# Patient Record
Sex: Male | Born: 1970 | Race: White | Hispanic: No | Marital: Married | Smoking: Never smoker
Health system: Southern US, Community
[De-identification: ages and names within clinical notes are randomized; demographics above are authoritative.]

## PROBLEM LIST (undated history)

## (undated) DIAGNOSIS — I2510 Atherosclerotic heart disease of native coronary artery without angina pectoris: Secondary | ICD-10-CM

## (undated) DIAGNOSIS — E785 Hyperlipidemia, unspecified: Secondary | ICD-10-CM

## (undated) HISTORY — DX: Atherosclerotic heart disease of native coronary artery without angina pectoris: I25.10

## (undated) HISTORY — DX: Hyperlipidemia, unspecified: E78.5

---

## 2008-03-22 DIAGNOSIS — I2510 Atherosclerotic heart disease of native coronary artery without angina pectoris: Secondary | ICD-10-CM

## 2008-03-22 HISTORY — DX: Atherosclerotic heart disease of native coronary artery without angina pectoris: I25.10

## 2008-04-05 ENCOUNTER — Ambulatory Visit: Payer: Self-pay | Admitting: Diagnostic Radiology

## 2008-04-05 ENCOUNTER — Encounter: Payer: Self-pay | Admitting: Emergency Medicine

## 2008-04-06 ENCOUNTER — Observation Stay (HOSPITAL_COMMUNITY): Admission: EM | Admit: 2008-04-06 | Discharge: 2008-04-06 | Payer: Self-pay | Admitting: Cardiovascular Disease

## 2008-04-12 ENCOUNTER — Ambulatory Visit (HOSPITAL_COMMUNITY): Admission: RE | Admit: 2008-04-12 | Discharge: 2008-04-12 | Payer: Self-pay | Admitting: Cardiovascular Disease

## 2010-01-19 IMAGING — CT CT HEART MORP W/ CTA COR W/ SCORE W/ CA W/CM &/OR W/O CM
1 of 3 series · 12 of 20 positions shown, 15 images · IV contrast (APPLIED)
Comparison: none

Addendum Begins  Original report by Dr. Ognian Berkan.  Following addendum by Dr. Angie Camila Ferrero on 04/21/2008:
 CARDIAC CTA REPORT
 REFERRING PHYSICIAN:  Quidgley, Marvin Martinez Duarte
 [place] physician: Nadler, Janay, Ph.D.
 REASON FOR PROCEDURE:  The patient had a recent syncopal event and after evaluation by Dr. Holger Mario Bahua at [REDACTED], he was referred for cardiac CT.  He does have a strong family history of coronary disease with his father having significant coronary disease noted in his early 40s.
 PROCEDURE IN DETAIL:  The risks and benefits of the procedure were detailed to the patient and consent was obtained.  He was brought to the CT scanner and placed in appropriate position on the table, 50 mg of IV metoprolol have to be given to decrease his heart rate to the low 60s.  Nitroglycerin 0.4 mg sublingual was given x1.  The usual algorithm of imaging was performed including a topographic image, calcium score, test bolus, and a final image with less than 120 cc of contrast use.  No complications were reported.  Heart rate at the time of imaging was 61 to 62 beats per minute, though this fluctuated up to high of 65 beats per minute.  The images were viewed on an independent computer workstation using 3D imaging software.  The patient was encouraged to drink plenty of fluids and he was discharged home after 15 minutes monitoring following the procedure.  No complications were reported by the patient post procedure.
 Coronary Anatomy:
 Left main:  The left main is a moderate-to-large-sized vessel that bifurcates into the LAD in the left circumflex.  No significant disease is noted.
 Left Anterior Descending:  The LAD is a moderate-to-large-sized vessel that extends to the apex and tapers distally.  There is some non-occlusive mild plaquing noted in the proximal LAD that is mixed calcified plaque.  He does have 1 D1 vessel that is small-to-moderate in size and takes off proximally.  He has a second diagonal branch that is moderate in size and bifurcates and takes off in the mid region of the LAD.  He has a third distal diagonal vessel that is small in caliber.  There is no other significant disease noted in the diagonal branches.
 Left Circumflex:   The left circumflex is a moderate-sized vessel with 2 small-to-moderate size obtuse marginal branches.  The OM1 takes off in the mid region and OM2 takes off distally.  True circumflex is a short vessel that does not extend very far.
 Right Coronary Artery:  The RCA is a large branch that extends distally and has a moderate-sized long PD and PL branch.  No significant disease noted.
 Cardiac Anatomy:
 Left atrium:  Left atrium is normal size.
 Right atrium is normal in size.
 Left ventricle:  Normal in size with normal wall thickness and normal systolic function.  No focal wall motion abnormalities noted.
 Right ventricle is normal in size with normal systolic function.
 Pulmonary artery appears to have normal caliber with no pathology or significant disease noted.
 Pulmonary Veins:  No evidence of stenosis or other pathology.
 Aorta:  No significant calcification noted and aorta is of normal caliber in the ascending and descending regions.  No evidence of dissection noted.
 FINAL IMPRESSION:
 Mild mixed calcified and non-calcified plaque noted in the proximal LAD that is not occlusive.  Otherwise, no significant disease noted.  No other cardiac abnormalities noted.  Coronary calcium score is 58.  This number reflects the calcium in the proximal LAD.  

 Addendum Ends
OVER-READ INTERPRETATION - CT CHEST
The following report is an over-read performed by radiologist Dr.
Roxana Angelica Biggs, M.D. [REDACTED] on 04/12/2008
[DATE].  This over-read does not include interpretation of
cardiac or coronary anatomy or pathology.  The CTA interpretation
by the cardiologist is attached.

[Series 9: 70% only · axial · 0.41mm/px · z∈[-214,-79]mm · 12 of 391 slices shown, 15 images]
[im 27/391  vessel]
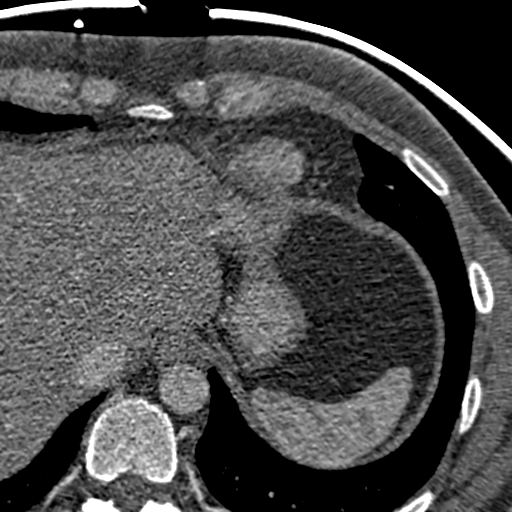
[im 27/391  lung]
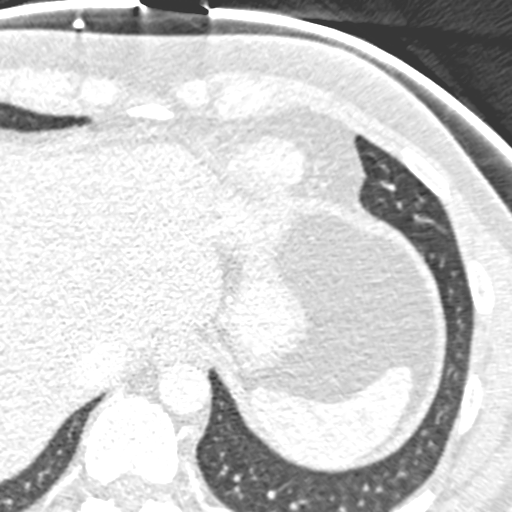
[im 53/391  vessel]
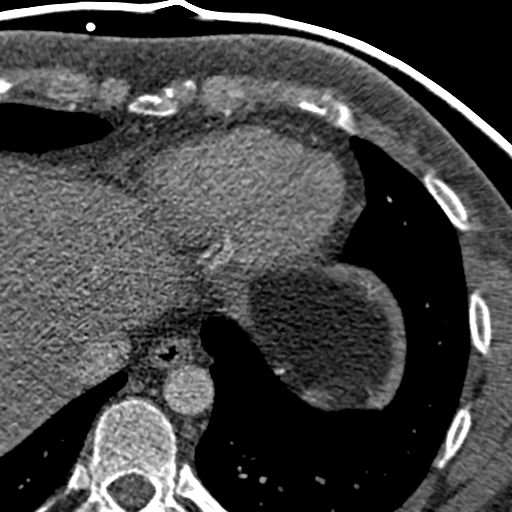
[im 79/391  vessel]
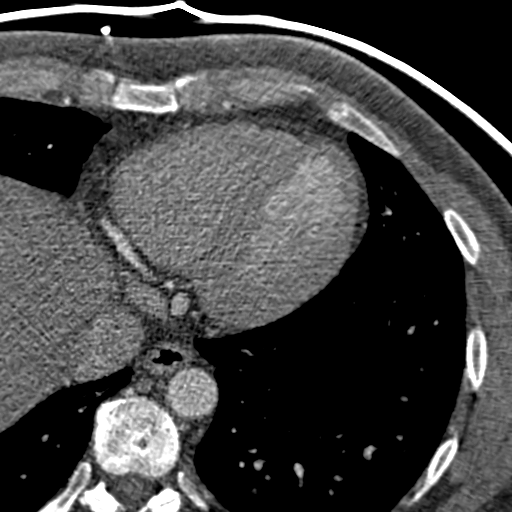
[im 131/391  vessel]
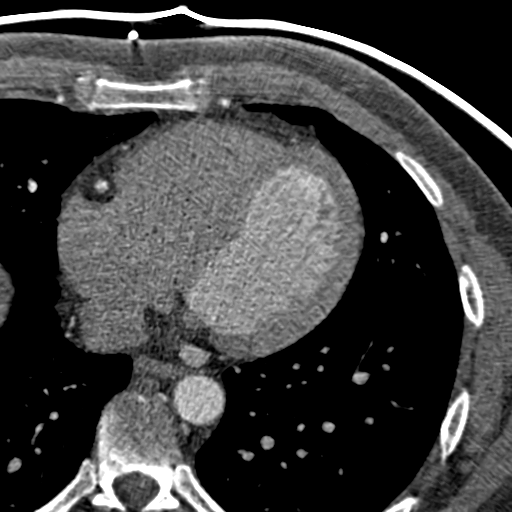
[im 157/391  vessel]
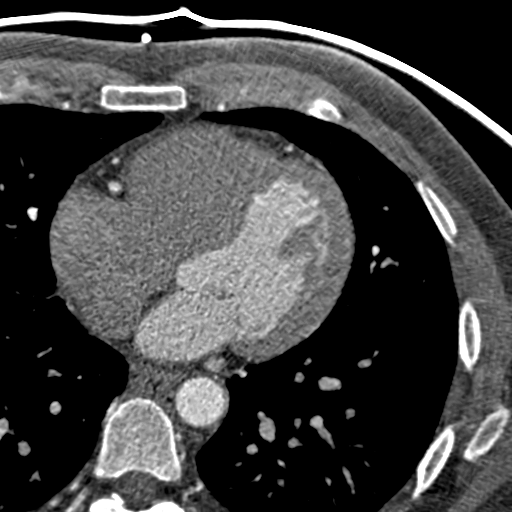
[im 157/391  lung]
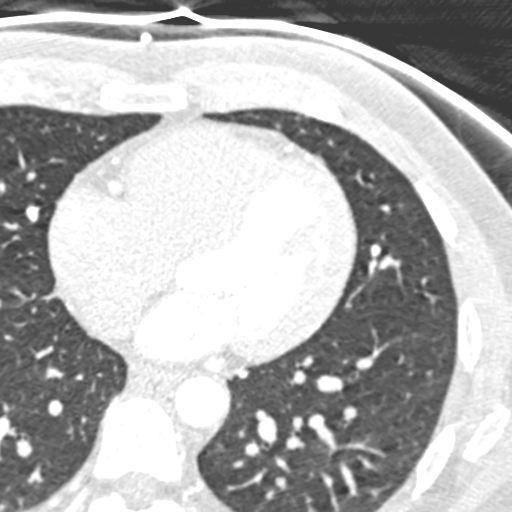
[im 183/391  vessel]
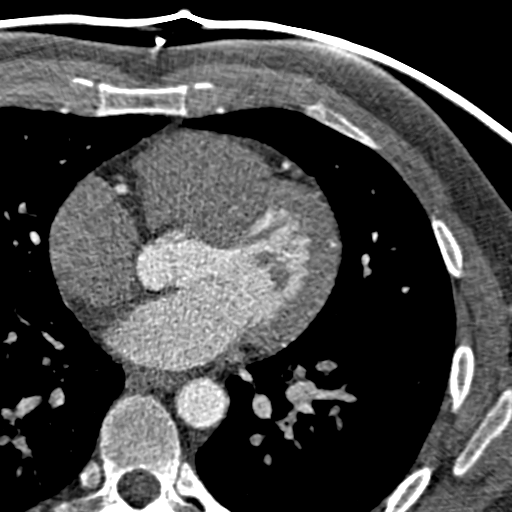
[im 209/391  vessel]
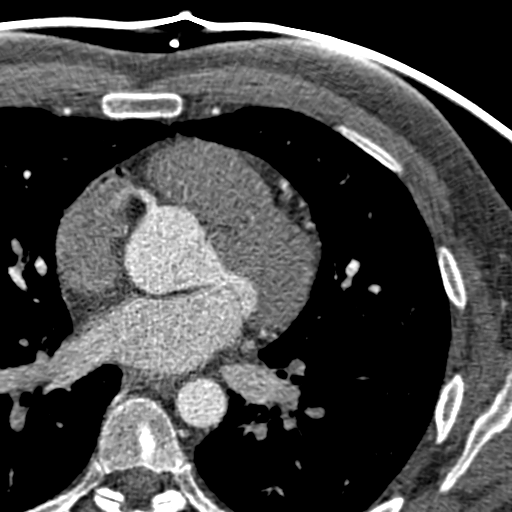
[im 235/391  vessel]
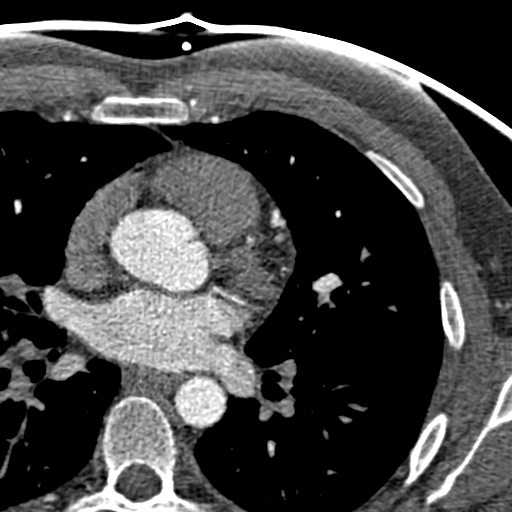
[im 261/391  vessel]
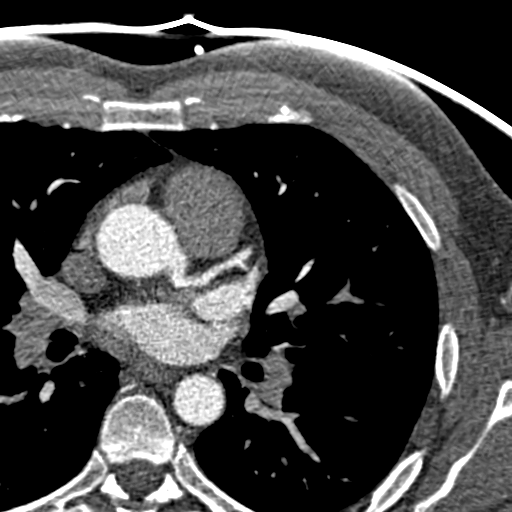
[im 261/391  lung]
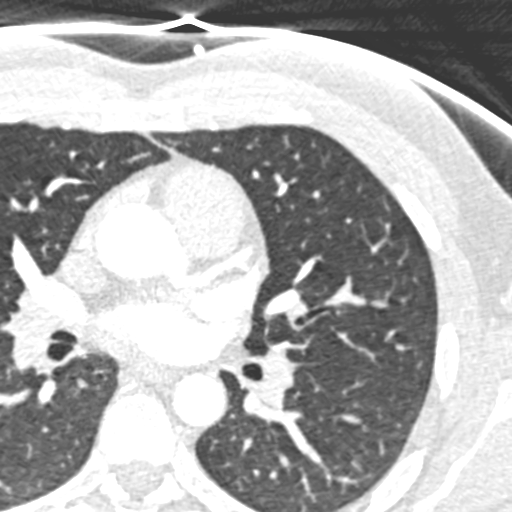
[im 313/391  vessel]
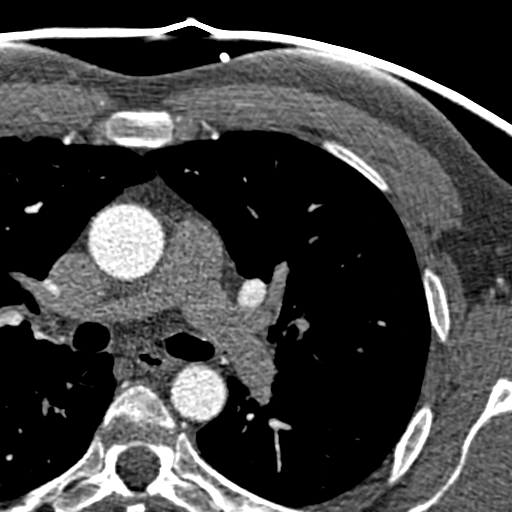
[im 339/391  vessel]
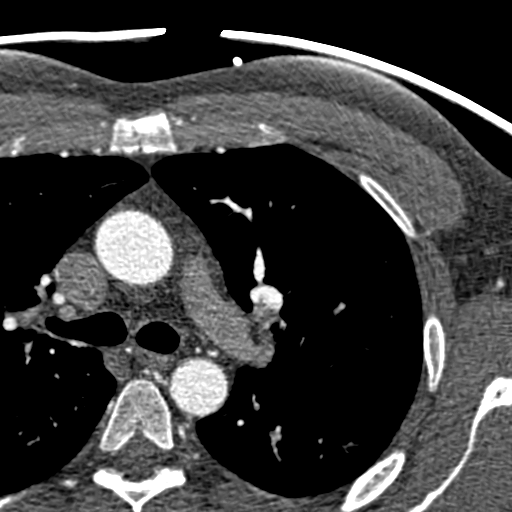
[im 365/391  vessel]
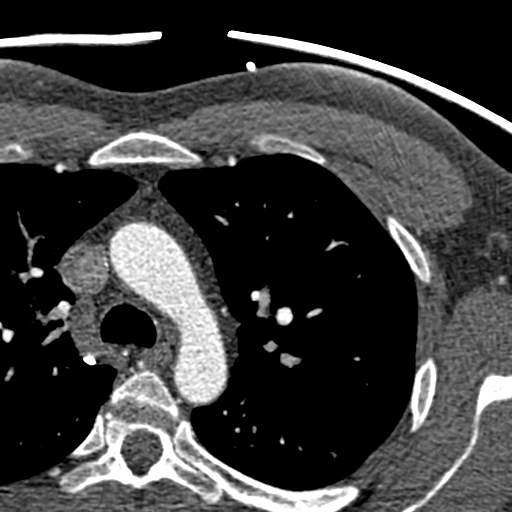

[12 of 20 positions shown; findings below may reference images not displayed]

FINDINGS: No pathologically enlarged lymph nodes in the visualized
portions of the mediastinum and both hilar regions.  Ascending
aorta measures 3.3 cm.  Tiny nodule along the left hemidiaphragm
may represent a small subpleural lymph node.  Lungs otherwise
clear.  No pleural fluid.  Visualized airway is unremarkable.
Incidental imaging of the upper abdomen shows a 6 mm low
attenuation lesion in the right hepatic lobe, which is likely a
small cyst or hemangioma.  No worrisome lytic or sclerotic lesions.
IMPRESSION: No acute extracardiac findings in the visualized portions of the
chest.

## 2010-05-04 LAB — BASIC METABOLIC PANEL
CO2: 27 mEq/L (ref 19–32)
Calcium: 8.7 mg/dL (ref 8.4–10.5)
Calcium: 9.2 mg/dL (ref 8.4–10.5)
GFR calc Af Amer: >60 mL/min (ref >60)
GFR calc Af Amer: >60 mL/min (ref >60)
GFR calc non Af Amer: >60 mL/min (ref >60)
Potassium: 3.5 mEq/L (ref 3.5–5.1)
Sodium: 138 mEq/L (ref 135–145)
Sodium: 140 mEq/L (ref 135–145)

## 2010-05-04 LAB — CARDIAC PANEL(CRET KIN+CKTOT+MB+TROPI)
CK, MB: 1.1 ng/mL (ref 0.3–4.0)
CK, MB: 1.1 ng/mL (ref 0.3–4.0)
Total CK: 131 U/L (ref 7–232)
Total CK: 132 U/L (ref 7–232)

## 2010-05-04 LAB — POCT CARDIAC MARKERS
CKMB, poc: <1 ng/mL — ABNORMAL LOW (ref 1.0–8.0)
Myoglobin, poc: 63.1 ng/mL (ref 12–200)

## 2010-05-04 LAB — CBC
HCT: 40.7 % (ref 39.0–52.0)
Hemoglobin: 14.1 g/dL (ref 13.0–17.0)
Hemoglobin: 14.7 g/dL (ref 13.0–17.0)
RBC: 5.16 MIL/uL (ref 4.22–5.81)
RDW: 12.9 % (ref 11.5–15.5)
WBC: 6.3 10*3/uL (ref 4.0–10.5)

## 2010-05-04 LAB — TSH: TSH: 0.786 u[IU]/mL (ref 0.350–4.500)

## 2010-05-04 LAB — MAGNESIUM: Magnesium: 2.1 mg/dL (ref 1.5–2.5)

## 2010-06-06 NOTE — Discharge Summary (Signed)
NAME:  Alexander Booker, Alexander Booker NO.:  [id number]   MEDICAL RECORD NO.:  [id number]           PATIENT TYPE:   LOCATION:                                 FACILITY:   PHYSICIAN:  Nicki Guadalajara, M.D.     DATE OF BIRTH:  September 17, 1970   DATE OF ADMISSION:  04/05/2008  DATE OF DISCHARGE:  04/05/2008                               DISCHARGE SUMMARY   DISCHARGE DIAGNOSES:  1. Vasovagal post micturition syncope.  2. Recent cardiac workup with negative stress test, negative echo, and      now negative myocardial infarction.  3. Strong family history of coronary disease including sudden death.  4. Dyslipidemia, treated and medication may have added to vasovagal      symptoms.  5. Vitamin D deficiency with treatment.   DISCHARGE CONDITION:  Improved.   PROCEDURES:  None.   DISCHARGE MEDICATIONS:  1. Lexapro 5 mg daily.  2. Vitamin D 50,000 units weekly.  3. Stop Simcor.  4. Crestor 10 mg daily.   DISCHARGE INSTRUCTIONS:  1. May return to work on April 07, 2008, low sodium, heart-healthy      diet.  2. No restrictions on activity.  3. Follow up with Dr. Tresa Endo, April 30, 2008, at 2:30 p.m.  4. He will be scheduled for a monitor, the office will call with date      and time.  He will be scheduled for a cardiac CAT scan,      angiography, the office will call with date and time.   HISTORY OF PRESENT ILLNESS:  A 40 year old white male presented to the  emergency room after an episode of syncope.  The patient initially had  been placed on Simcor due to dyslipidemia, and a strong family history  of coronary disease.  He did have hot flushing with it, and he sat up,  took sips of water, walked to the bathroom, and was seated on the  commode, urinating, and at the end of the urination, he had a syncopal  episode.  He did not have any chest pain or palpitations.  No neuro  deficits came too quickly, felt oozy for 30 minutes, and then complained  of headache.  He had struck his head  during the fall.  He went to the  [facility] at [facility].  CT of his head was done, which was  negative.  He was transferred to [facility] for workup of his syncope.  He previously had syncope greater than 10 years ago, and not associated  with urination, but he did not recall any further details.  He was  admitted mainly because his EKG was abnormal and there was no old one to  compare it, but on comparison today it is no different actually if  anything is now, EKGs are improved from the office EKGs.   OUTPATIENT MEDICATIONS:  1. Simcor 1000/20.  2. Lexapro 10.  3. Aspirin 81.  4. Vitamin D 50,000 units weekly.   ALLERGIES:  PENICILLIN.   PAST MEDICAL HISTORY:  Dyslipidemia, depression, and deviated septum.   FAMILY HISTORY:  See H and P.   SOCIAL HISTORY:  See H and P.   REVIEW OF SYSTEMS:  See H and P.   PHYSICAL EXAMINATION:  VITAL SIGNS:  At discharge, blood pressure this  morning 120/80, pulse 74, respirations 16, temp 97.9, and oxygen  saturation on room air.  Lying down, blood pressure 122/76, sitting  120/80, standing 128/77 with pulse 86 throughout.  He is afebrile.  GENERAL:  Alert and oriented male in no acute distress.  LUNGS:  Clear.  HEART:  Heart sounds S1, S2.  Regular rate and rhythm.  HEENT:  He does have ecchymosis around his right eye, which he had  injured in his syncopal episode.   RADIOLOGY:  Chest x-ray here on admission, no evidence of acute  cardiopulmonary disease.  CT of the head, no acute intracranial findings  or mass lesions.  There was a scalp hematoma of the right frontal area.  The globes were intact.   LABORATORY DATA:  Hemoglobin 14.1, hematocrit 40.7, WBC 6.3, platelets  200, MCV 85.3.  Chemistry:  Sodium 138; potassium was 3.5, it was  replaced with 40 of potassium; chloride 104; CO2 of 27; BUN 9;  creatinine is 0.95; and glucose 99.  Coags, initially, protime 15.7, INR  of 1.2, PTT on heparin was greater than 200, heparin  was discontinued,  and heparin down to 0.38.  Troponins; CK is 132 and 131, MB is 1.1 and  1.1, and troponin I less than 0.01.   Electrolytes:  Calcium 8.7, magnesium 2.1, D-dimer was negative at less  than 0.22, and TSH was 0.786.   The patient's EKG with T wave inversions in II, III, aVF; and V4, V5,  and V6 without change if anything improved.  The patient was admitted by  Dr. Tresa Endo during the morning hours of April 05, 2008.  He was seen and  felt stable.  Cardiac markers have all returned negative.  CK-MB, troponins, and EKG  is stable.  The patient is nonorthostatic, he will be discharged home,  and set up for CT and cardiac angio as well as a monitor to wear.   Dr. Tresa Endo saw him and assessed him.      Darcella Gasman. Annie Paras, N.P.    ______________________________  Nicki Guadalajara, M.D.    LRI/MEDQ  D:  04/06/2008  T:  04/07/2008  Job:  [id number]   cc:   Sigmund Hazel, M.D.  Nicki Guadalajara, M.D.

## 2012-04-01 ENCOUNTER — Encounter: Payer: Self-pay | Admitting: Cardiology

## 2012-04-18 ENCOUNTER — Encounter: Payer: Self-pay | Admitting: Cardiovascular Disease

## 2012-06-20 ENCOUNTER — Other Ambulatory Visit: Payer: Self-pay | Admitting: *Deleted

## 2012-06-20 MED ORDER — METOPROLOL SUCCINATE ER 50 MG PO TB24: 50.0000 mg | ORAL_TABLET | Freq: Every day | ORAL | Status: DC

## 2012-08-11 ENCOUNTER — Other Ambulatory Visit: Payer: Self-pay | Admitting: *Deleted

## 2012-08-11 MED ORDER — ROSUVASTATIN CALCIUM 20 MG PO TABS: 20.0000 mg | ORAL_TABLET | Freq: Every day | ORAL | Status: DC

## 2012-09-18 ENCOUNTER — Ambulatory Visit (INDEPENDENT_AMBULATORY_CARE_PROVIDER_SITE_OTHER): Payer: BC Managed Care – PPO | Admitting: Cardiovascular Disease

## 2012-09-18 ENCOUNTER — Encounter: Payer: Self-pay | Admitting: Cardiovascular Disease

## 2012-09-18 VITALS — BP 110/78 | HR 65 | Ht 69.0 in | Wt 177.1 lb

## 2012-09-18 DIAGNOSIS — R5381 Other malaise: Secondary | ICD-10-CM

## 2012-09-18 DIAGNOSIS — Z8249 Family history of ischemic heart disease and other diseases of the circulatory system: Secondary | ICD-10-CM

## 2012-09-18 DIAGNOSIS — E782 Mixed hyperlipidemia: Secondary | ICD-10-CM

## 2012-09-18 DIAGNOSIS — I2510 Atherosclerotic heart disease of native coronary artery without angina pectoris: Secondary | ICD-10-CM

## 2012-09-18 DIAGNOSIS — E785 Hyperlipidemia, unspecified: Secondary | ICD-10-CM | POA: Insufficient documentation

## 2012-09-18 NOTE — Progress Notes (Signed)
Patient ID: Alexander Booker, male   DOB: 02-28-1970, 42 y.o.   MRN: [id number]     HPI: Alexander Booker, is a 42 y.o. male who presents to the office today for one-year cardiology evaluation.  Alexander Booker has a very strong family history for premature coronary artery disease. His father had suffered initial heart attack at age 3 and has undergone 2 CBG revascularization surgeries. 3 of 4 of his father's siblings had significant heart problems with one uncle suffering a heart attack in his 77s with one dying at age 59 with sudden death.  Alexander Booker has a history of hyperlipidemia. He has been demonstrated to have inferolateral T-wave inversion on his ECG. Cardiac CT in the past with a calcium score 58 with small nonocclusive plaque that was mixed and calcified in the proximal LAD. A nuclear perfusion study in 2012 this showed normal perfusion. He has low HDL levels. In the past he was unable to tolerate Niaspan. Remotely had performed a [place] lab assessment.  Over the past year, he has been traveling with his job in [employer]. This has reduced the amount of exercise that he had been able to do. When I had seen him last year he had lost almost 20 pounds from the year before. He has gained approximately half of that back. He denies chest pain PND orthopnea or shortness of breath.  Past Medical History  Diagnosis Date  . Coronary artery disease 3/10    non obsructive Ca++ on CT of [place]  . Dyslipidemia     History reviewed. No pertinent past surgical history.  Allergies  Allergen Reactions  . Penicillins Rash    seizures  . Niacin And Related     Causes blackouts    Current Outpatient Prescriptions  Medication Sig Dispense Refill  . aspirin 81 MG tablet Take 81 mg by mouth daily.      . citalopram (CELEXA) 20 MG tablet Take 20 mg by mouth daily.      . metoprolol succinate (TOPROL-XL) 50 MG 24 hr tablet Take 1 tablet (50 mg total) by mouth daily. Take with or immediately  following a meal.  30 tablet  4  . rosuvastatin (CRESTOR) 20 MG tablet Take 1 tablet (20 mg total) by mouth daily. Need office appointment before anymore refills  30 tablet  1   No current facility-administered medications for this visit.    History   Social History  . Marital Status: Married    Spouse Name: N/A    Number of Children: N/A  . Years of Education: N/A   Occupational History  . Not on file.   Social History Main Topics  . Smoking status: Never Smoker   . Smokeless tobacco: Never Used  . Alcohol Use: 0.5 oz/week    1 drink(s) per week  . Drug Use: No  . Sexual Activity: Not on file   Other Topics Concern  . Not on file   Social History Narrative  . No narrative on file    Family History  Problem Relation Age of Onset  . Coronary artery disease Father 74    CABG twice  . Heart attack Father     ROS is negative for fevers, chills or night sweats. HEENT denies visual changes. He denies palpitations. Denies presyncope or syncope. There is no shortness of breath. There is no chest pain. He denies abdominal pain. He denies claudication. He denies paresthesias or myalgias.  Other system review is negative.  PE BP 110/78  Pulse 65  Ht 5\' 9"  (1.753 m)  Wt 177 lb 1.6 oz (80.332 kg)  BMI 26.14 kg/m2  General: Alert, oriented, no distress.  Skin: normal turgor, no rashes HEENT: Normocephalic, atraumatic. Pupils round and reactive; sclera anicteric;no lid lag.  Nose without nasal septal hypertrophy Mouth/Parynx benign; Mallinpatti scale 2 Neck: No JVD, no carotid briuts Lungs: clear to ausculatation and percussion; no wheezing or rales Heart: RRR, s1 s2 normal  Abdomen: No bruit; soft, nontender; no hepatosplenomehaly, BS+; abdominal aorta nontender and not dilated by palpation. Pulses 2+ Extremities: no clubbing cyanosis or edema, Homan's sign negative  Neurologic: grossly nonfocal  ECG: Sinus rhythm at 65 beats per minute. Normal intervals. Previously  noted nondiagnostic ST changes in leads 3 and aVF.  LABS:  BMET    Component Value Date/Time   NA 138 04/06/2008 0327   K 3.5 04/06/2008 0327   CL 104 04/06/2008 0327   CO2 27 04/06/2008 0327   GLUCOSE 99 04/06/2008 0327   BUN 9 04/06/2008 0327   CREATININE 0.95 04/06/2008 0327   CALCIUM 8.7 04/06/2008 0327   GFRNONAA >60 04/06/2008 0327   GFRAA  Value: >60        The eGFR has been calculated using the MDRD equation. This calculation has not been validated in all clinical situations. eGFR's persistently <60 mL/min signify possible Chronic Kidney Disease. 04/06/2008 0327     Hepatic Function Panel  No results found for this basename: prot, albumin, ast, alt, alkphos, bilitot, bilidir, ibili     CBC    Component Value Date/Time   WBC 6.3 04/06/2008 0815   RBC 4.78 04/06/2008 0815   HGB 14.1 04/06/2008 0815   HCT 40.7 04/06/2008 0815   PLT 200 04/06/2008 0815   MCV 85.3 04/06/2008 0815   MCHC 34.6 04/06/2008 0815   RDW 12.9 04/06/2008 0815     BNP No results found for this basename: probnp    Lipid Panel  No results found for this basename: chol, trig, hdl, cholhdl, vldl, ldlcalc     RADIOLOGY: No results found.    ASSESSMENT AND PLAN: Alexander Booker will be 42 years old in 2 days. He does have very strong family history for premature coronary artery disease and has a history of hyperlipidemia with low HDL levels. He has been tolerating Crestor 20 mg daily. He tells me his 41 year old brother was recently found to have a cholesterol of 300. The patient will undergo complete set of fasting lab work consisting of a CBC, competent metabolic panel, and NMR lipoprofile, and TSH. I will notify him regarding the results of the blood work and if changes need to be made adjustments will be made accordingly. Otherwise, I will see him in one year for followup evaluation.     Lennette Bihari, MD, [facility]  09/18/2012 10:22 AM

## 2012-09-18 NOTE — Patient Instructions (Signed)
Your physician recommends that you return for lab work fasting.  Your physician recommends that you schedule a follow-up appointment in: 1 YEAR 

## 2012-10-08 ENCOUNTER — Other Ambulatory Visit: Payer: Self-pay | Admitting: Cardiovascular Disease

## 2012-10-09 NOTE — Telephone Encounter (Signed)
Rx was sent to pharmacy electronically. 

## 2012-11-24 LAB — CBC
HCT: 41.9 % (ref 39.0–52.0)
Hemoglobin: 14.8 g/dL (ref 13.0–17.0)
MCV: 82.3 fL (ref 78.0–100.0)
RDW: 13.2 % (ref 11.5–15.5)
WBC: 5.7 10*3/uL (ref 4.0–10.5)

## 2012-11-25 LAB — COMPREHENSIVE METABOLIC PANEL
ALT: 19 U/L (ref 0–53)
CO2: 28 mEq/L (ref 19–32)
Creat: 1 mg/dL (ref 0.50–1.35)
Total Bilirubin: 1 mg/dL (ref 0.3–1.2)

## 2012-11-25 LAB — NMR LIPOPROFILE WITH LIPIDS
HDL Particle Number: 29.3 umol/L — ABNORMAL LOW (ref >=30.5)
LDL (calc): 65 mg/dL (ref <100)
LDL Particle Number: 1212 nmol/L — ABNORMAL HIGH (ref <1000)
LDL Size: 19.8 nm — ABNORMAL LOW (ref >20.5)
LP-IR Score: 65 — ABNORMAL HIGH (ref <=45)
Small LDL Particle Number: 952 nmol/L — ABNORMAL HIGH (ref <=527)
VLDL Size: 48.8 nm — ABNORMAL HIGH (ref <=46.6)

## 2012-12-01 ENCOUNTER — Other Ambulatory Visit: Payer: Self-pay | Admitting: *Deleted

## 2012-12-01 MED ORDER — METOPROLOL SUCCINATE ER 50 MG PO TB24: 50.0000 mg | ORAL_TABLET | Freq: Every day | ORAL | Status: DC

## 2013-05-12 ENCOUNTER — Telehealth: Payer: Self-pay | Admitting: Cardiovascular Disease

## 2013-05-12 MED ORDER — ROSUVASTATIN CALCIUM 40 MG PO TABS: 40.0000 mg | ORAL_TABLET | Freq: Every day | ORAL | Status: DC

## 2013-05-12 NOTE — Telephone Encounter (Signed)
He would like to have lab results that he had about 2 month ago please.

## 2013-05-12 NOTE — Telephone Encounter (Signed)
Call to pt and verified x 2.  Results given per MD in Result Note.  Pt verbalized understanding.   Rx sent to pharmacy: Crestor 40 mg daily; 90-day supply per pt request.  Pt also requested a copy of his lab results to be mailed and new code for MyChart.  (Done)  Message forwarded to Burna MortimerWanda, CMA to review w/ Dr. Tresa EndoKelly if/when labs to be repeated and mail lab order.

## 2013-07-06 ENCOUNTER — Other Ambulatory Visit: Payer: Self-pay | Admitting: Cardiovascular Disease

## 2013-07-06 NOTE — Telephone Encounter (Signed)
Rx was sent to pharmacy electronically. 

## 2013-09-07 ENCOUNTER — Other Ambulatory Visit: Payer: Self-pay | Admitting: *Deleted

## 2013-09-07 MED ORDER — METOPROLOL SUCCINATE ER 50 MG PO TB24: 50.0000 mg | ORAL_TABLET | Freq: Every day | ORAL | Status: DC

## 2013-09-15 ENCOUNTER — Other Ambulatory Visit: Payer: Self-pay

## 2013-09-15 MED ORDER — ROSUVASTATIN CALCIUM 40 MG PO TABS: 40.0000 mg | ORAL_TABLET | Freq: Every day | ORAL | Status: DC

## 2013-09-15 NOTE — Telephone Encounter (Signed)
Rx was sent to pharmacy electronically. 

## 2013-10-05 ENCOUNTER — Other Ambulatory Visit: Payer: Self-pay

## 2013-10-05 MED ORDER — METOPROLOL SUCCINATE ER 50 MG PO TB24: 50.0000 mg | ORAL_TABLET | Freq: Every day | ORAL | Status: DC

## 2013-10-05 NOTE — Telephone Encounter (Signed)
Rx was sent to pharmacy electronically. Second warning given. 

## 2013-10-13 ENCOUNTER — Other Ambulatory Visit: Payer: Self-pay | Admitting: *Deleted

## 2013-11-11 ENCOUNTER — Other Ambulatory Visit: Payer: Self-pay

## 2013-11-11 MED ORDER — METOPROLOL SUCCINATE ER 50 MG PO TB24: 50.0000 mg | ORAL_TABLET | Freq: Every day | ORAL | Status: DC

## 2013-11-11 NOTE — Telephone Encounter (Signed)
Rx sent to pharmacy   

## 2013-11-17 ENCOUNTER — Other Ambulatory Visit: Payer: Self-pay | Admitting: *Deleted

## 2013-11-17 MED ORDER — METOPROLOL SUCCINATE ER 50 MG PO TB24: 50.0000 mg | ORAL_TABLET | Freq: Every day | ORAL | Status: DC

## 2013-11-17 MED ORDER — ROSUVASTATIN CALCIUM 40 MG PO TABS: 40.0000 mg | ORAL_TABLET | Freq: Every day | ORAL | Status: DC

## 2013-11-21 ENCOUNTER — Other Ambulatory Visit: Payer: Self-pay | Admitting: Cardiovascular Disease

## 2013-11-23 NOTE — Telephone Encounter (Signed)
Rx was sent to pharmacy electronically. 

## 2013-12-15 ENCOUNTER — Encounter: Payer: Self-pay | Admitting: Cardiovascular Disease

## 2013-12-15 ENCOUNTER — Ambulatory Visit (INDEPENDENT_AMBULATORY_CARE_PROVIDER_SITE_OTHER): Payer: BC Managed Care – PPO | Admitting: Cardiovascular Disease

## 2013-12-15 VITALS — BP 106/72 | HR 71 | Ht 69.0 in | Wt 175.1 lb

## 2013-12-15 DIAGNOSIS — Z79899 Other long term (current) drug therapy: Secondary | ICD-10-CM

## 2013-12-15 DIAGNOSIS — E7849 Other hyperlipidemia: Secondary | ICD-10-CM

## 2013-12-15 DIAGNOSIS — J0191 Acute recurrent sinusitis, unspecified: Secondary | ICD-10-CM

## 2013-12-15 DIAGNOSIS — J329 Chronic sinusitis, unspecified: Secondary | ICD-10-CM | POA: Insufficient documentation

## 2013-12-15 DIAGNOSIS — R899 Unspecified abnormal finding in specimens from other organs, systems and tissues: Secondary | ICD-10-CM

## 2013-12-15 DIAGNOSIS — Z8249 Family history of ischemic heart disease and other diseases of the circulatory system: Secondary | ICD-10-CM

## 2013-12-15 DIAGNOSIS — E785 Hyperlipidemia, unspecified: Secondary | ICD-10-CM

## 2013-12-15 DIAGNOSIS — E784 Other hyperlipidemia: Secondary | ICD-10-CM

## 2013-12-15 MED ORDER — AZITHROMYCIN 250 MG PO TABS: ORAL_TABLET | ORAL | Status: DC

## 2013-12-15 NOTE — Progress Notes (Signed)
Patient ID: Alexander Booker, male   DOB: Jun 28, 1970, 43 y.o.   MRN: [id number]     HPI: Alexander Booker is a 43 y.o. male who presents to the office today for one-year cardiology evaluation.  Alexander Booker has a very strong family history for premature coronary artery disease. His father had suffered initial heart attack at age 13 and underwent 2 CABG revascularization surgeries. Three of 4 of his father's siblings had significant heart problems with one uncle suffering a heart attack in his 47s and  one dying at age 79 with sudden death.  Alexander Booker has a history of hyperlipidemia. Alexander Booker has been demonstrated to have inferolateral T-wave inversion on his ECG. A cardiac CT Alexander Booker had a calcium score 58 with small nonocclusive plaque that was mixed and calcified in the proximal LAD. A nuclear perfusion study in 2012 this showed normal perfusion. Alexander Booker has low HDL levels. In the past Alexander Booker was unable to tolerate Niaspan. Remotely had performed a [facility] lab assessment.  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 This has reduced the amount of exercise that Alexander Booker had been able to do. When I had seen him last year Alexander Booker had lost almost 20 pounds from the year before. Alexander Booker has gained approximately half of that back. Alexander Booker denies chest pain PND orthopnea or shortness of breath.  Last year, I recommended Alexander Booker undergo an NMR LipoProfile.  Interestingly, his total cholesterol was 122, triglycerides 84, HDL 40 and LDL cholesterol was 65.  However, Alexander Booker had significant increased small LDL particles at 952 leading to a significant increase in LDL particle number at 1212.  Alexander Booker also was insulin resistant at 65 and had significant increased VLDL size, concordant with an atherogenic dyslipidemic pattern.  Once that lab was complete, I recommended further titration of his Crestor from 20 mg to 40 mg.  Alexander Booker has tolerated this well without myalgias.  Alexander Booker denies chest pain.  Alexander Booker denies PND or orthopnea.  Alexander Booker denies change in  exercise tolerance.  Alexander Booker states that over the past several days Alexander Booker has had significant increase in sinus issues with production of greenish sputum.  Past Medical History  Diagnosis Date  . Coronary artery disease 3/10    non obsructive Ca++ on CT of [place]  . Dyslipidemia     History reviewed. No pertinent past surgical history.  Allergies  Allergen Reactions  . Penicillins Rash    seizures  . Niacin And Related     Causes blackouts    Current Outpatient Prescriptions  Medication Sig Dispense Refill  . aspirin 81 MG tablet Take 81 mg by mouth daily.    . citalopram (CELEXA) 20 MG tablet Take 20 mg by mouth daily.    . metoprolol succinate (TOPROL-XL) 50 MG 24 hr tablet Take 1 tablet (50 mg total) by mouth daily. MUST KEEP APPOINTMENT 12/15/2013 FOR FUTURE REFILLS. 30 tablet 0  . rosuvastatin (CRESTOR) 40 MG tablet Take 1 tablet (40 mg total) by mouth daily. PT NEEDS APPOINTMENT FOR FUTURE REFILLS. 30 tablet 0   No current facility-administered medications for this visit.    History   Social History  . Marital Status: Married    Spouse Name: N/A    Number of Children: N/A  . Years of Education: N/A   Occupational History  . Not on file.   Social History Main Topics  . Smoking status: Never Smoker   . Smokeless tobacco: Never Used  .  Alcohol Use: 0.5 oz/week    1 drink(s) per week  . Drug Use: No  . Sexual Activity: Not on file   Other Topics Concern  . Not on file   Social History Narrative   Socially Alexander Booker is married.  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  Alexander Booker has 2 children.  Family History  Problem Relation Age of Onset  . Coronary artery disease Father 65    CABG twice  . Heart attack Father    ROS General: Negative; No fevers, chills, or night sweats;  HEENT: Positive for increased sinus congestion/early infection; No changes in vision or hearing,  difficulty swallowing Pulmonary: Negative; No cough, wheezing, shortness of  breath, hemoptysis Cardiovascular: Negative; No chest pain, presyncope, syncope, palpitations GI: Negative; No nausea, vomiting, diarrhea, or abdominal pain GU: Negative; No dysuria, hematuria, or difficulty voiding Musculoskeletal: Negative; no myalgias, joint pain, or weakness Hematologic/Oncology: Negative; no easy bruising, bleeding Endocrine: Negative; no heat/cold intolerance; no diabetes Neuro: Negative; no changes in balance, headaches Skin: Negative; No rashes or skin lesions Psychiatric: Negative; No behavioral problems, depression Sleep: Negative; No snoring, daytime sleepiness, hypersomnolence, bruxism, restless legs, hypnogognic hallucinations, no cataplexy Other comprehensive 14 point system review is negative.   PE BP 106/72 mmHg  Pulse 71  Ht 5' 9" (1.753 m)  Wt 175 lb 1.6 oz (79.425 kg)  BMI 25.85 kg/m2  General: Alert, oriented, no distress.  Skin: normal turgor, no rashes HEENT: Normocephalic, atraumatic. Pupils round and reactive; sclera anicteric;no lid lag.  Nose without nasal septal hypertrophy Mouth/Parynx benign; Mallinpatti scale 2 Neck: No JVD, no carotid bruits with normal carotid upstroke Lungs: clear to ausculatation and percussion; no wheezing or rales Chest wall: Nontender to palpation Heart: RRR, s1 s2 normal; no S3 or S4 gallop.  No murmurs, rubs, thrills or heaves Abdomen: No bruit; soft, nontender; no hepatosplenomehaly, BS+; abdominal aorta nontender and not dilated by palpation. Back: No CVA tenderness Pulses 2+ Extremities: no clubbing cyanosis or edema, Homan's sign negative  Neurologic: grossly nonfocal Psychological: Normal affect and mood  ECG (independently read by me): Normal sinus rhythm at 71 bpm.  Nonspecific T-wave changes inferiorly.  ECG: Sinus rhythm at 65 beats per minute. Normal intervals. Previously noted nondiagnostic ST changes in leads 3 and aVF.  LABS:  BMET    Component Value Date/Time   NA 142 11/24/2012 0817    K 4.6 11/24/2012 0817   CL 106 11/24/2012 0817   CO2 28 11/24/2012 0817   GLUCOSE 91 11/24/2012 0817   BUN 13 11/24/2012 0817   CREATININE 1.00 11/24/2012 0817   CREATININE 0.95 04/06/2008 0327   CALCIUM 9.2 11/24/2012 0817   GFRNONAA >60 04/06/2008 0327   GFRAA  04/06/2008 0327    >60        The eGFR has been calculated using the MDRD equation. This calculation has not been validated in all clinical situations. eGFR's persistently <60 mL/min signify possible Chronic Kidney Disease.     Hepatic Function Panel     Component Value Date/Time   PROT 6.5 11/24/2012 0817     CBC    Component Value Date/Time   WBC 5.7 11/24/2012 0817   RBC 5.09 11/24/2012 0817   HGB 14.8 11/24/2012 0817   HCT 41.9 11/24/2012 0817   PLT 200 11/24/2012 0817   MCV 82.3 11/24/2012 0817   MCH 29.1 11/24/2012 0817   MCHC 35.3 11/24/2012 0817   RDW 13.2 11/24/2012 0817  BNP No results found for: PROBNP  Lipid Panel  No results found for: CHOL   RADIOLOGY: No results found.    ASSESSMENT AND PLAN: Alexander Booker is a 43 year old white male who has a very strong family history for premature coronary artery disease and has a history of hyperlipidemia with low HDL levels in the past.  Interestingly, his last lipid panel but otherwise seemed to be normal.  If NMR assessment had not been performed.  Despite an LDL of 65.  Alexander Booker had significant increased small LDL particles.  Last year at 952 contributing to increased LDL particle number at 1212.  In addition, Alexander Booker had a significant increased insulin resistance score.  I discussed with him.  Improve diet, particularly with reference to sweets and carbohydrates.  Alexander Booker's not had follow-up laboratory over the past year.  Repeat blood work will be obtained in the fasting state including a Cmet, CBC, TSH, hemoglobin A1c, NMR profile as well as fasting insulin level.  Alexander Booker did have significant insulin resistance noted on his last evaluation.  We discussed  the importance of increased exercise and how it relates to improvement in insulin resistance.  Alexander Booker also may be having early sinus infection.  I have  given him a prescription for azithromycin in a Z-Pak.  I will contact him regarding his laboratory results and if necessary adjustments will be made to his medical regimen.  I will see him in one year for cardiology reevaluation or sooner from his arise.  Time spent: 25 minutes  Troy Sine, MD, [facility]  12/15/2013 11:00 AM

## 2013-12-15 NOTE — Patient Instructions (Signed)
Your physician recommends that you return for lab work fasting.  Your physician has recommended you make the following change in your medication: start zithromax prescription. Take as directed. This has already been sent to your [facility].  Your physician wants you to follow-up in: 1 year or sooner if needed. You will receive a reminder letter in the mail two months in advance. If you don't receive a letter, please call our office to schedule the follow-up appointment.

## 2013-12-21 ENCOUNTER — Other Ambulatory Visit: Payer: Self-pay

## 2013-12-21 MED ORDER — METOPROLOL SUCCINATE ER 50 MG PO TB24: 50.0000 mg | ORAL_TABLET | Freq: Every day | ORAL | Status: DC

## 2013-12-21 MED ORDER — ROSUVASTATIN CALCIUM 40 MG PO TABS: 40.0000 mg | ORAL_TABLET | Freq: Every day | ORAL | Status: DC

## 2013-12-21 NOTE — Telephone Encounter (Signed)
Rx was sent to pharmacy electronically. 

## 2014-02-18 LAB — CBC
HEMATOCRIT: 45.3 % (ref 39.0–52.0)
Hemoglobin: 15.4 g/dL (ref 13.0–17.0)
MCH: 28.5 pg (ref 26.0–34.0)
MCHC: 34 g/dL (ref 30.0–36.0)
MCV: 83.9 fL (ref 78.0–100.0)
MPV: 9.8 fL (ref 9.4–12.4)
Platelets: 214 10*3/uL (ref 150–400)
RBC: 5.4 MIL/uL (ref 4.22–5.81)
RDW: 13.7 % (ref 11.5–15.5)
WBC: 5.5 10*3/uL (ref 4.0–10.5)

## 2014-02-19 LAB — COMPREHENSIVE METABOLIC PANEL
ALK PHOS: 55 U/L (ref 39–117)
ALT: 28 U/L (ref 0–53)
AST: 22 U/L (ref 0–37)
Albumin: 4.3 g/dL (ref 3.5–5.2)
BUN: 14 mg/dL (ref 6–23)
CALCIUM: 9.2 mg/dL (ref 8.4–10.5)
CO2: 29 meq/L (ref 19–32)
CREATININE: 0.9 mg/dL (ref 0.50–1.35)
Chloride: 104 mEq/L (ref 96–112)
Glucose, Bld: 84 mg/dL (ref 70–99)
POTASSIUM: 4.3 meq/L (ref 3.5–5.3)
SODIUM: 138 meq/L (ref 135–145)
Total Bilirubin: 1.2 mg/dL (ref 0.2–1.2)
Total Protein: 6.7 g/dL (ref 6.0–8.3)

## 2014-02-19 LAB — HEMOGLOBIN A1C
HEMOGLOBIN A1C: 5.7 % — AB (ref <5.7)
Mean Plasma Glucose: 117 mg/dL — ABNORMAL HIGH (ref <117)

## 2014-02-19 LAB — INSULIN, FASTING: Insulin fasting, serum: 8.1 u[IU]/mL (ref 2.0–19.6)

## 2014-02-19 LAB — TSH: TSH: 0.976 u[IU]/mL (ref 0.350–4.500)

## 2014-03-01 ENCOUNTER — Encounter: Payer: Self-pay | Admitting: *Deleted

## 2018-01-30 ENCOUNTER — Encounter: Payer: Self-pay | Admitting: Cardiovascular Disease

## 2018-01-30 ENCOUNTER — Ambulatory Visit: Payer: BLUE CROSS/BLUE SHIELD | Admitting: Cardiovascular Disease

## 2018-01-30 VITALS — BP 128/82 | Ht 69.0 in | Wt 180.8 lb

## 2018-01-30 DIAGNOSIS — R079 Chest pain, unspecified: Secondary | ICD-10-CM | POA: Diagnosis not present

## 2018-01-30 DIAGNOSIS — I2510 Atherosclerotic heart disease of native coronary artery without angina pectoris: Secondary | ICD-10-CM

## 2018-01-30 DIAGNOSIS — R9431 Abnormal electrocardiogram [ECG] [EKG]: Secondary | ICD-10-CM | POA: Diagnosis not present

## 2018-01-30 DIAGNOSIS — I2584 Coronary atherosclerosis due to calcified coronary lesion: Secondary | ICD-10-CM

## 2018-01-30 DIAGNOSIS — R0609 Other forms of dyspnea: Secondary | ICD-10-CM

## 2018-01-30 DIAGNOSIS — E785 Hyperlipidemia, unspecified: Secondary | ICD-10-CM

## 2018-01-30 NOTE — Patient Instructions (Addendum)
Medication Instructions:  The current medical regimen is effective;  continue present plan and medications.  If you need a refill on your cardiac medications before your next appointment, please call your pharmacy.   Lab work: CMET, CBC, Lipid, TSH BMET 1 week before CT. If you have labs (blood work) drawn today and your tests are completely normal, you will receive your results only by: Marland Kitchen MyChart Message (if you have MyChart) OR . A paper copy in the mail If you have any lab test that is abnormal or we need to change your treatment, we will call you to review the results.  Testing/Procedures: Echocardiogram - Your physician has requested that you have an echocardiogram. Echocardiography is a painless test that uses sound waves to create images of your heart. It provides your doctor with information about the size and shape of your heart and how well your heart's chambers and valves are working. This procedure takes approximately one hour. There are no restrictions for this procedure. This will be performed at our [facility] location - [address], [address].  Your physician has requested that you have cardiac CT. Cardiac computed tomography (CT) is a painless test that uses an x-ray machine to take clear, detailed pictures of your heart. For further information please visit [email or web address] Please follow instruction sheet as given.   Follow-Up: At [facility], you and your health needs are our priority.  As part of our continuing mission to provide you with exceptional heart care, we have created designated Provider Care Teams.  These Care Teams include your primary Cardiologist (physician) and Advanced Practice Providers (APPs -  Physician Assistants and Nurse Practitioners) who all work together to provide you with the care you need, when you need it. You will need a follow up appointment in 6 weeks.  Please call our office 2 months in advance to schedule this appointment.  You may  see Dr.Kelly or one of the following Advanced Practice Providers on your designated Care Team: Azalee Course, [place] . Micah Flesher, PA-C  Any Other Special Instructions Will Be Listed Below (If Applicable).      Please arrive at the [facility] at xx:xx AM (30-45 minutes prior to test start time)  [facility] [address] [place], [place] [address] [phone]  Proceed to the [facility] Radiology Department (First Floor).  Please follow these instructions carefully (unless otherwise directed):  Hold all erectile dysfunction medications at least 48 hours prior to test.  On the Night Before the Test: . Be sure to Drink plenty of water. . Do not consume any caffeinated/decaffeinated beverages or chocolate 12 hours prior to your test. . Do not take any antihistamines 12 hours prior to your test. . If you take Metformin do not take 24 hours prior to test.  On the Day of the Test: . Drink plenty of water. Do not drink any water within one hour of the test. . Do not eat any food 4 hours prior to the test. . You may take your regular medications prior to the test.  . Take metoprolol (Lopressor) two hours prior to test. . HOLD Furosemide/Hydrochlorothiazide morning of the test.       After the Test: . Drink plenty of water. . After receiving IV contrast, you may experience a mild flushed feeling. This is normal. . On occasion, you may experience a mild rash up to 24 hours after the test. This is not  dangerous. If this occurs, you can take Benadryl 25 mg and increase your fluid intake. . If you experience trouble breathing, this can be serious. If it is severe call 911 IMMEDIATELY. If it is mild, please call our office. . If you take any of these medications: Glipizide/Metformin, Avandament, Glucavance, please do not take 48 hours after completing test.

## 2018-01-30 NOTE — Progress Notes (Signed)
Cardiology Office Note    Date:  01/31/2018   ID:  Chevis, Weisensel 1970/06/19, MRN [id number]  PCP:  Chesley Noon, MD  Cardiologist:  Shelva Majestic, MD   Reestablishment of cardiology care; last seen November 2015.  History of Present Illness:  Alexander Booker is a 48 y.o. male to the office today to reestablish cardiology care.  Alexander Booker has a very strong family history for premature coronary artery disease. His father had suffered initial heart attack at age 36 and underwent 2 CABG revascularization surgeries. Three of 4 of his father's siblings had significant heart problems with one uncle suffering a heart attack in his 54s and  one dying at age 68 with sudden death.  Alexander Booker has a history of hyperlipidemia. He has been demonstrated to have inferolateral T-wave inversion on his ECG.  In 2010, a cardiac CT was performed and revealed  a calcium score 58 with small nonocclusive plaque that was mixed and calcified in the proximal LAD. A nuclear perfusion study in 2012 showed normal perfusion. He has low HDL levels. In the past he was unable to tolerate Niaspan. Remotely had performed a [place] lab assessment.  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 This has reduced the amount of exercise that he had been able to do. When I had seen him last year he had lost almost 20 pounds from the year before. He has gained approximately half of that back. He denies chest pain PND orthopnea or shortness of breath.  He underwent an NMR LipoProfile: total cholesterol was 122, triglycerides 84, HDL 40 and LDL cholesterol was 65.  However, he had significant increased small LDL particles at 952 leading to a significant increase in LDL particle number at 1212.  He also was insulin resistant at 65 and had significant increased VLDL size, concordant with an atherogenic dyslipidemic pattern.  Once that lab was complete, I recommended further titration of his Crestor  from 20 mg to 40 mg.  He has tolerated this well without myalgias.  Over the past 5 years he has continued to do fairly well.  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  Recently, an employee from work died suddenly at age 40 and another friend of his died with an MI at age 26.  He has noticed that in comparison to walking on flat surfaces when he tends to walk uphill or walk up steps he begins to notice exertional shortness of breath which she had not previously experienced.  With recent deaths of close acquaintances he presents now for follow-up cardiology route evaluation.   Past Medical History:  Diagnosis Date  . Coronary artery disease 3/10   non obsructive Ca++ on CT of [place]  . Dyslipidemia     No past surgical history on file.  Current Medications: Outpatient Medications Prior to Visit  Medication Sig Dispense Refill  . aspirin 81 MG tablet Take 81 mg by mouth daily.    . citalopram (CELEXA) 20 MG tablet Take 20 mg by mouth daily.    . metoprolol succinate (TOPROL-XL) 50 MG 24 hr tablet Take 1 tablet (50 mg total) by mouth daily. 30 tablet 11  . rosuvastatin (CRESTOR) 40 MG tablet Take 1 tablet (40 mg total) by mouth daily. 30 tablet 11  . azithromycin (ZITHROMAX Z-PAK) 250 MG tablet Take as  directed 6 each 0   No facility-administered medications prior to visit.      Allergies:   Penicillins and Niacin and related   Social History   Socioeconomic History  . Marital status: Married    Spouse name: Not on file  . Number of children: Not on file  . Years of education: Not on file  . Highest education level: Not on file  Occupational History  . Not on file  Social Needs  . Financial resource strain: Not on file  . Food insecurity:    Worry: Not on file    Inability: Not on file  . Transportation needs:    Medical: Not on file    Non-medical: Not on file  Tobacco Use   . Smoking status: Never Smoker  . Smokeless tobacco: Never Used  Substance and Sexual Activity  . Alcohol use: Yes    Alcohol/week: 1.0 standard drinks    Types: 1 drink(s) per week  . Drug use: No  . Sexual activity: Not on file  Lifestyle  . Physical activity:    Days per week: Not on file    Minutes per session: Not on file  . Stress: Not on file  Relationships  . Social connections:    Talks on phone: Not on file    Gets together: Not on file    Attends religious service: Not on file    Active member of club or organization: Not on file    Attends meetings of clubs or organizations: Not on file    Relationship status: Not on file  Other Topics Concern  . Not on file  Social History Narrative  . Not on file     Family History:  The patient's family history includes Coronary artery disease (age of onset: 53) in his father; Heart attack in his father.   ROS General: Negative; No fevers, chills, or night sweats;  HEENT: Negative; No changes in vision or hearing, sinus congestion, difficulty swallowing Pulmonary: Negative; No cough, wheezing, shortness of breath, hemoptysis Cardiovascular: Positive for exertional shortness of breath with uphill walking or climbing steps; GI: Negative; No nausea, vomiting, diarrhea, or abdominal pain GU: Negative; No dysuria, hematuria, or difficulty voiding Musculoskeletal: Negative; no myalgias, joint pain, or weakness Hematologic/Oncology: Negative; no easy bruising, bleeding Endocrine: Negative; no heat/cold intolerance; no diabetes Neuro: Negative; no changes in balance, headaches Skin: Negative; No rashes or skin lesions Psychiatric: Negative; No behavioral problems, depression Sleep: Negative; No snoring, daytime sleepiness, hypersomnolence, bruxism, restless legs, hypnogognic hallucinations, no cataplexy Other comprehensive 14 point system review is negative.   PHYSICAL EXAM:   VS:  BP 128/82   Ht '5\' 9"'  (1.753 m)   Wt 180 lb  12.8 oz (82 kg)   BMI 26.70 kg/m    Repeat blood pressure by me was 126/80  Wt Readings from Last 3 Encounters:  01/30/18 180 lb 12.8 oz (82 kg)  12/15/13 175 lb 1.6 oz (79.4 kg)  09/18/12 177 lb 1.6 oz (80.3 kg)    General: Alert, oriented, no distress.  Skin: normal turgor, no rashes, warm and dry HEENT: Normocephalic, atraumatic. Pupils equal round and reactive to light; sclera anicteric; extraocular muscles intact; Fundi without hemorrhages or exudates Nose without nasal septal hypertrophy Mouth/Parynx benign; Mallinpatti scale 2 Neck: No JVD, no carotid bruits; normal carotid upstroke Lungs: clear to ausculatation and percussion; no wheezing or rales Chest wall: without tenderness to palpitation Heart: PMI not displaced, RRR, s1 s2 normal, 1/6 systolic murmur, no  diastolic murmur, no rubs, gallops, thrills, or heaves Abdomen: soft, nontender; no hepatosplenomehaly, BS+; abdominal aorta nontender and not dilated by palpation. Back: no CVA tenderness Pulses 2+ Musculoskeletal: full range of motion, normal strength, no joint deformities Extremities: no clubbing cyanosis or edema, Homan's sign negative  Neurologic: grossly nonfocal; Cranial nerves grossly wnl Psychologic: Normal mood and affect   Studies/Labs Reviewed:   EKG:  EKG is  ordered today.  ECG (independently read by me): Normal sinus rhythm at 69 bpm.  Nonspecific T wave abnormality inferiorly and in leads V4 through V6.  December 15, 2013 ECG (independently read by me): Normal sinus rhythm at 71 bpm.  Nonspecific T changes in lead III and aVF.  Recent Labs: BMP Latest Ref Rng & Units 02/18/2014 11/24/2012 04/06/2008  Glucose 70 - 99 mg/dL 84 91 99  BUN 6 - 23 mg/dL '14 13 9  ' Creatinine 0.50 - 1.35 mg/dL 0.90 1.00 0.95  Sodium 135 - 145 mEq/L 138 142 138  Potassium 3.5 - 5.3 mEq/L 4.3 4.6 3.5  Chloride 96 - 112 mEq/L 104 106 104  CO2 19 - 32 mEq/L '29 28 27  ' Calcium 8.4 - 10.5 mg/dL 9.2 9.2 8.7     Hepatic  Function Latest Ref Rng & Units 02/18/2014 11/24/2012  Total Protein 6.0 - 8.3 g/dL 6.7 6.5  Albumin 3.5 - 5.2 g/dL 4.3 4.2  AST 0 - 37 U/L 22 17  ALT 0 - 53 U/L 28 19  Alk Phosphatase 39 - 117 U/L 55 56  Total Bilirubin 0.2 - 1.2 mg/dL 1.2 1.0    CBC Latest Ref Rng & Units 02/18/2014 11/24/2012 04/06/2008  WBC 4.0 - 10.5 K/uL 5.5 5.7 6.3  Hemoglobin 13.0 - 17.0 g/dL 15.4 14.8 14.1  Hematocrit 39.0 - 52.0 % 45.3 41.9 40.7  Platelets 150 - 400 K/uL 214 200 200   Lab Results  Component Value Date   MCV 83.9 02/18/2014   MCV 82.3 11/24/2012   MCV 85.3 04/06/2008   Lab Results  Component Value Date   TSH 0.976 02/18/2014   Lab Results  Component Value Date   HGBA1C 5.7 (H) 02/18/2014     BNP No results found for: BNP  ProBNP No results found for: PROBNP   Lipid Panel     Component Value Date/Time   CHOL 122 11/24/2012 0817   TRIG 84 11/24/2012 0817   HDL 40 11/24/2012 0817   LDLCALC 65 11/24/2012 0817     RADIOLOGY: No results found.   Additional studies/ records that were reviewed today include:     ASSESSMENT:    1. Abnormal EKG   2. Exertional dyspnea   3. Coronary artery calcification   4. Chest pain, unspecified type   5. Hyperlipidemia with target LDL less than 70     PLAN:  Alexander Booker is a 48 year old gentleman who has a strong family history for premature coronary artery disease with multiple family members having suffered initial heart attacks in the 11s.  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  I had increased his rosuvastatin to 40 mg and I have not seen him for 5 years.  He has recently developed symptoms of exertional shortness of breath particularly with walking uphill or climbing steps.  He denies significant chest tightness.  He has been on Toprol-XL  50 mg daily his  ECG today reveals sinus rhythm at 69 with inferior and anterolateral T wave abnormalities.  I am recommending that he undergo a complete set of fasting laboratory.  10 years ago, he had undergone prior cardiac CT imaging which suggested small nonocclusive plaque in the proximal LAD.  I am scheduling him for a 2D echo Doppler study in light of his exertional shortness of breath and will also schedule him for a CT coronary angios to assess for potential progressive coronary atherosclerosis.  I will contact him regarding his laboratory.  I will see him in 6 weeks for reevaluation.  Medication Adjustments/Labs and Tests Ordered: Current medicines are reviewed at length with the patient today.  Concerns regarding medicines are outlined above.  Medication changes, Labs and Tests ordered today are listed in the Patient Instructions below. Patient Instructions  Medication Instructions:  The current medical regimen is effective;  continue present plan and medications.  If you need a refill on your cardiac medications before your next appointment, please call your pharmacy.   Lab work: CMET, CBC, Lipid, TSH BMET 1 week before CT. If you have labs (blood work) drawn today and your tests are completely normal, you will receive your results only by: Marland Kitchen MyChart Message (if you have MyChart) OR . A paper copy in the mail If you have any lab test that is abnormal or we need to change your treatment, we will call you to review the results.  Testing/Procedures: Echocardiogram - Your physician has requested that you have an echocardiogram. Echocardiography is a painless test that uses sound waves to create images of your heart. It provides your doctor with information about the size and shape of your heart and how well your heart's chambers and valves are working. This procedure takes approximately one hour. There are no restrictions for this procedure. This will be performed at our [facility] location  - [address].  Your physician has requested that you have cardiac CT. Cardiac computed tomography (CT) is a painless test that uses an x-ray machine to take clear, detailed pictures of your heart. For further information please visit [facility]. Please follow instruction sheet as given.   Follow-Up: At [facility], you and your health needs are our priority.  As part of our continuing mission to provide you with exceptional heart care, we have created designated Provider Care Teams.  These Care Teams include your primary Cardiologist (physician) and Advanced Practice Providers (APPs -  Physician Assistants and Nurse Practitioners) who all work together to provide you with the care you need, when you need it. You will need a follow up appointment in 6 weeks.  Please call our office 2 months in advance to schedule this appointment.  You may see Dr.Twinkle Sockwell or one of the following Advanced Practice Providers on your designated Care Team: Almyra Deforest, [place] . Fabian Sharp, PA-C  Any Other Special Instructions Will Be Listed Below (If Applicable).      Please arrive at the [facility] main entrance of [facility] at xx:xx AM (30-45 minutes prior to test start time)  [facility] [place], [place] [address] [phone]  Proceed to the [facility] Radiology Department (First Floor).  Please follow these instructions carefully (unless otherwise directed):  Hold all erectile dysfunction medications at least 48 hours prior to test.  On the Night Before the Test: . Be sure to Drink plenty of water. . Do not consume any caffeinated/decaffeinated beverages or chocolate  12 hours prior to your test. . Do not take any antihistamines 12 hours prior to your test. . If you take Metformin do not take 24 hours prior to test.  On the Day of the Test: . Drink plenty of water. Do not drink any water within one hour of the test. . Do not eat any  food 4 hours prior to the test. . You may take your regular medications prior to the test.  . Take metoprolol (Lopressor) two hours prior to test. . HOLD Furosemide/Hydrochlorothiazide morning of the test.       After the Test: . Drink plenty of water. . After receiving IV contrast, you may experience a mild flushed feeling. This is normal. . On occasion, you may experience a mild rash up to 24 hours after the test. This is not dangerous. If this occurs, you can take Benadryl 25 mg and increase your fluid intake. . If you experience trouble breathing, this can be serious. If it is severe call 911 IMMEDIATELY. If it is mild, please call our office. . If you take any of these medications: Glipizide/Metformin, Avandament, Glucavance, please do not take 48 hours after completing test.       Signed, Shelva Majestic, MD  01/31/2018 7:30 PM    [facility] [address], [place], [place], [place]  [address] Phone: [phone]

## 2018-01-31 ENCOUNTER — Encounter: Payer: Self-pay | Admitting: Cardiovascular Disease

## 2018-02-04 LAB — COMPREHENSIVE METABOLIC PANEL
ALK PHOS: 46 IU/L (ref 39–117)
ALT: 23 IU/L (ref 0–44)
AST: 27 IU/L (ref 0–40)
Albumin/Globulin Ratio: 1.9 (ref 1.2–2.2)
Albumin: 4 g/dL (ref 3.5–5.5)
BUN / CREAT RATIO: 15 (ref 9–20)
BUN: 15 mg/dL (ref 6–24)
Bilirubin Total: 0.7 mg/dL (ref 0.0–1.2)
CHLORIDE: 107 mmol/L — AB (ref 96–106)
CO2: 22 mmol/L (ref 20–29)
CREATININE: 0.99 mg/dL (ref 0.76–1.27)
Calcium: 8.9 mg/dL (ref 8.7–10.2)
GFR calc Af Amer: 103 mL/min/{1.73_m2} (ref >59)
GFR calc non Af Amer: 89 mL/min/{1.73_m2} (ref >59)
GLOBULIN, TOTAL: 2.1 g/dL (ref 1.5–4.5)
Glucose: 92 mg/dL (ref 65–99)
Potassium: 4.4 mmol/L (ref 3.5–5.2)
SODIUM: 142 mmol/L (ref 134–144)
Total Protein: 6.1 g/dL (ref 6.0–8.5)

## 2018-02-04 LAB — CBC
Hematocrit: 40.5 % (ref 37.5–51.0)
Hemoglobin: 13.9 g/dL (ref 13.0–17.7)
MCH: 28.8 pg (ref 26.6–33.0)
MCHC: 34.3 g/dL (ref 31.5–35.7)
MCV: 84 fL (ref 79–97)
Platelets: 203 10*3/uL (ref 150–450)
RBC: 4.82 x10E6/uL (ref 4.14–5.80)
RDW: 12.7 % (ref 11.6–15.4)
WBC: 3.7 10*3/uL (ref 3.4–10.8)

## 2018-02-04 LAB — LIPID PANEL
Chol/HDL Ratio: 3.9 ratio (ref 0.0–5.0)
Cholesterol, Total: 140 mg/dL (ref 100–199)
HDL: 36 mg/dL — ABNORMAL LOW (ref >39)
LDL Calculated: 87 mg/dL (ref 0–99)
TRIGLYCERIDES: 87 mg/dL (ref 0–149)
VLDL Cholesterol Cal: 17 mg/dL (ref 5–40)

## 2018-02-04 LAB — TSH: TSH: 0.923 u[IU]/mL (ref 0.450–4.500)

## 2018-02-14 ENCOUNTER — Ambulatory Visit (HOSPITAL_COMMUNITY): Payer: BLUE CROSS/BLUE SHIELD | Attending: Cardiovascular Disease

## 2018-02-14 DIAGNOSIS — R079 Chest pain, unspecified: Secondary | ICD-10-CM | POA: Diagnosis present

## 2018-02-14 DIAGNOSIS — R9431 Abnormal electrocardiogram [ECG] [EKG]: Secondary | ICD-10-CM

## 2018-03-04 ENCOUNTER — Telehealth (HOSPITAL_COMMUNITY): Payer: Self-pay | Admitting: Emergency Medicine

## 2018-03-04 ENCOUNTER — Encounter (HOSPITAL_COMMUNITY): Payer: Self-pay | Admitting: Emergency Medicine

## 2018-03-04 NOTE — Telephone Encounter (Signed)
Left message on voicemail with name and callback number Tajai Suder RN Navigator Cardiac Imaging [facility] [phone] Office [phone] Cell  

## 2018-03-06 ENCOUNTER — Encounter (HOSPITAL_COMMUNITY): Payer: Self-pay

## 2018-03-06 ENCOUNTER — Ambulatory Visit (HOSPITAL_COMMUNITY): Admission: RE | Admit: 2018-03-06 | Payer: BLUE CROSS/BLUE SHIELD | Source: Ambulatory Visit

## 2018-03-06 ENCOUNTER — Ambulatory Visit (HOSPITAL_COMMUNITY)
Admission: RE | Admit: 2018-03-06 | Discharge: 2018-03-06 | Disposition: A | Discharge status: Home / Self Care | Payer: BLUE CROSS/BLUE SHIELD | Source: Ambulatory Visit | Attending: Cardiovascular Disease | Admitting: Cardiovascular Disease

## 2018-03-06 DIAGNOSIS — R9431 Abnormal electrocardiogram [ECG] [EKG]: Secondary | ICD-10-CM | POA: Insufficient documentation

## 2018-03-06 DIAGNOSIS — R079 Chest pain, unspecified: Secondary | ICD-10-CM

## 2018-03-06 MED ORDER — IOPAMIDOL (ISOVUE-370) INJECTION 76%
80.0000 mL | Freq: Once | INTRAVENOUS | Status: AC | PRN
  Administered 2018-03-06: 80 mL via INTRAVENOUS

## 2018-03-06 MED ORDER — NITROGLYCERIN 0.4 MG SL SUBL
SUBLINGUAL_TABLET | SUBLINGUAL | Status: AC
  Filled 2018-03-06: qty 2

## 2018-03-06 MED ORDER — NITROGLYCERIN 0.4 MG SL SUBL
0.8000 mg | SUBLINGUAL_TABLET | Freq: Once | SUBLINGUAL | Status: AC
  Administered 2018-03-06: 0.8 mg via SUBLINGUAL
  Filled 2018-03-06: qty 25

## 2018-03-19 ENCOUNTER — Encounter: Payer: Self-pay | Admitting: Cardiovascular Disease

## 2018-03-19 ENCOUNTER — Ambulatory Visit: Payer: BLUE CROSS/BLUE SHIELD | Admitting: Cardiovascular Disease

## 2018-03-19 VITALS — BP 120/70 | HR 65 | Ht 69.0 in | Wt 182.2 lb

## 2018-03-19 DIAGNOSIS — I2584 Coronary atherosclerosis due to calcified coronary lesion: Secondary | ICD-10-CM

## 2018-03-19 DIAGNOSIS — E785 Hyperlipidemia, unspecified: Secondary | ICD-10-CM

## 2018-03-19 DIAGNOSIS — I2510 Atherosclerotic heart disease of native coronary artery without angina pectoris: Secondary | ICD-10-CM

## 2018-03-19 DIAGNOSIS — R0609 Other forms of dyspnea: Secondary | ICD-10-CM

## 2018-03-19 DIAGNOSIS — Z8249 Family history of ischemic heart disease and other diseases of the circulatory system: Secondary | ICD-10-CM | POA: Diagnosis not present

## 2018-03-19 MED ORDER — AMLODIPINE BESYLATE 5 MG PO TABS: ORAL_TABLET | ORAL | 3 refills | Status: DC

## 2018-03-19 MED ORDER — EZETIMIBE 10 MG PO TABS: 10.0000 mg | ORAL_TABLET | Freq: Every day | ORAL | 3 refills | Status: DC

## 2018-03-19 NOTE — Patient Instructions (Signed)
Medication Instructions:  Start Amlodipine 5 mg. -- start 0.5 tablet (2.5 mg) at night for 1 week, if you still notice issues take the entire tablet.  Start Zetia 10 mg daily. Move Toprol in the morning.   If you need a refill on your cardiac medications before your next appointment, please call your pharmacy.   Lab work: CMET, LIPID in 2 months If you have labs (blood work) drawn today and your tests are completely normal, you will receive your results only by: Marland Kitchen MyChart Message (if you have MyChart) OR . A paper copy in the mail If you have any lab test that is abnormal or we need to change your treatment, we will call you to review the results.  Follow-Up: At [facility], you and your health needs are our priority.  As part of our continuing mission to provide you with exceptional heart care, we have created designated Provider Care Teams.  These Care Teams include your primary Cardiologist (physician) and Advanced Practice Providers (APPs -  Physician Assistants and Nurse Practitioners) who all work together to provide you with the care you need, when you need it. You will need a follow up appointment in 10 weeks.  Please call our office 2 months in advance to schedule this appointment.  You may see Dr.Kelly or one of the following Advanced Practice Providers on your designated Care Team: Azalee Course, [place] . Micah Flesher, PA-C

## 2018-03-19 NOTE — Progress Notes (Signed)
Cardiology Office Note    Date:  03/21/2018   ID:  Alexander Booker, Alexander Booker March 06, 1970, MRN [id number]  PCP:  Alexander Noon, MD  Cardiologist:  Alexander Majestic, MD    History of Present Illness:  Alexander Booker is a 48 y.o. male who presents to the office today for a 6-week follow-up cardiology evaluation after his recent reestablishment of care on January 30, 2018 after not having been seen since November 2015.  Alexander Booker has a very strong family history for premature coronary artery disease. His father had suffered initial heart attack at age 25 and underwent 2 CABG revascularization surgeries. Three of 4 of his father's siblings had significant heart problems with one uncle suffering a heart attack in his 36s and  one dying at age 62 with sudden death.  Alexander Booker has a history of hyperlipidemia. He has been demonstrated to have inferolateral T-wave inversion on his ECG.  In 2010, a cardiac CT was performed and revealed  a calcium score 58 with small nonocclusive plaque that was mixed and calcified in the proximal LAD. A nuclear perfusion study in 2012 showed normal perfusion. He has low HDL levels. In the past he was unable to tolerate Niaspan. Remotely had performed a [facility] lab assessment.  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 This has reduced the amount of exercise that he had been able to do. When I had seen him last year he had lost almost 20 pounds from the year before. He has gained approximately half of that back. He denies chest pain PND orthopnea or shortness of breath.  He underwent an NMR LipoProfile: total cholesterol was 122, triglycerides 84, HDL 40 and LDL cholesterol was 65.  However, he had significant increased small LDL particles at 952 leading to a significant increase in LDL particle number at 1212.  He also was insulin resistant at 65 and had significant increased VLDL size, concordant with an atherogenic dyslipidemic pattern.   Once that lab was complete, I recommended further titration of his Crestor from 20 mg to 40 mg.  He has tolerated this well without myalgias.  Over the past 5 years he has continued to do fairly well.  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  Recently, an employee from work died suddenly at age 15 and another friend of his died with an MI at age 60.  He has noticed that in comparison to walking on flat surfaces when he tends to walk uphill or walk up steps he begins to notice exertional shortness of breath which he had not previously experienced.  With recent deaths of close acquaintances he presents now for follow-up cardiology route evaluation.  Saw him on January 30, 2018 when he reestablish cardiology care with me.  At that time, he had noticed a vague sensation in his chest and development of some mild exertional shortness of breath with significant activity.  I scheduled him to undergo an echo Doppler study which was done on January 25, 2018 and showed normal systolic and diastolic function.  He had normal valvular architecture.  I referred him for a coronary CTA which was done on March 07, 2018.  Coronary calcium score was 157 agonist on units placing him in the 93rd percentile for age and gender.  He was found to have mild nonobstructive disease with mixed plaque  in the proximal LAD felt to be mild and less than 50%.  There also was mild calcified plaque without significant stenosis in the mid RCA.  FFR analysis was not done.  He still notes some mild exertional dyspnea with significant exertion.  He denies any other chest discomfort or palpitations.  He denies PND orthopnea.  He denies exertional dyspnea.  He presents for follow-up assessment.  Past Medical History:  Diagnosis Date  . Coronary artery disease 3/10   non obsructive Ca++ on CT of [place]  . Dyslipidemia     No past surgical history  on file.  Current Medications: Outpatient Medications Prior to Visit  Medication Sig Dispense Refill  . aspirin 81 MG tablet Take 81 mg by mouth daily.    . citalopram (CELEXA) 20 MG tablet Take 20 mg by mouth daily.    . metoprolol succinate (TOPROL-XL) 50 MG 24 hr tablet Take 1 tablet (50 mg total) by mouth daily. 30 tablet 11  . rosuvastatin (CRESTOR) 40 MG tablet Take 1 tablet (40 mg total) by mouth daily. 30 tablet 11   No facility-administered medications prior to visit.      Allergies:   Penicillins and Niacin and related   Social History   Socioeconomic History  . Marital status: Married    Spouse name: Not on file  . Number of children: Not on file  . Years of education: Not on file  . Highest education level: Not on file  Occupational History  . Not on file  Social Needs  . Financial resource strain: Not on file  . Food insecurity:    Worry: Not on file    Inability: Not on file  . Transportation needs:    Medical: Not on file    Non-medical: Not on file  Tobacco Use  . Smoking status: Never Smoker  . Smokeless tobacco: Never Used  Substance and Sexual Activity  . Alcohol use: Yes    Alcohol/week: 1.0 standard drinks    Types: 1 drink(s) per week  . Drug use: No  . Sexual activity: Not on file  Lifestyle  . Physical activity:    Days per week: Not on file    Minutes per session: Not on file  . Stress: Not on file  Relationships  . Social connections:    Talks on phone: Not on file    Gets together: Not on file    Attends religious service: Not on file    Active member of club or organization: Not on file    Attends meetings of clubs or organizations: Not on file    Relationship status: Not on file  Other Topics Concern  . Not on file  Social History Narrative  . Not on file     Family History:  The patient's family history includes Coronary artery disease (age of onset: 80) in his father; Heart attack in his father.   ROS General: Negative; No  fevers, chills, or night sweats;  HEENT: Negative; No changes in vision or hearing, sinus congestion, difficulty swallowing Pulmonary: Negative; No cough, wheezing, shortness of breath, hemoptysis Cardiovascular: Positive for exertional shortness of breath with uphill walking or climbing steps; GI: Negative; No nausea, vomiting, diarrhea, or abdominal pain GU: Negative; No dysuria, hematuria, or difficulty voiding Musculoskeletal: Negative; no myalgias, joint pain, or weakness Hematologic/Oncology: Negative; no easy bruising, bleeding Endocrine: Negative; no heat/cold intolerance; no diabetes Neuro: Negative; no changes in balance, headaches Skin: Negative; No rashes or skin lesions Psychiatric: Negative;  No behavioral problems, depression Sleep: Negative; No snoring, daytime sleepiness, hypersomnolence, bruxism, restless legs, hypnogognic hallucinations, no cataplexy Other comprehensive 14 point system review is negative.   PHYSICAL EXAM:   VS:  BP 120/70   Pulse 65   Ht 5' 9" (1.753 m)   Wt 182 lb 3.2 oz (82.6 kg)   BMI 26.91 kg/m    Repeat blood pressure by me was 122/74  Wt Readings from Last 3 Encounters:  03/19/18 182 lb 3.2 oz (82.6 kg)  01/30/18 180 lb 12.8 oz (82 kg)  12/15/13 175 lb 1.6 oz (79.4 kg)    General: Alert, oriented, no distress.  Skin: normal turgor, no rashes, warm and dry HEENT: Normocephalic, atraumatic. Pupils equal round and reactive to light; sclera anicteric; extraocular muscles intact;  Nose without nasal septal hypertrophy Mouth/Parynx benign; Mallinpatti scale 2 Neck: No JVD, no carotid bruits; normal carotid upstroke Lungs: clear to ausculatation and percussion; no wheezing or rales Chest wall: without tenderness to palpitation Heart: PMI not displaced, RRR, s1 s2 normal, 1/6 systolic murmur, no diastolic murmur, no rubs, gallops, thrills, or heaves Abdomen: soft, nontender; no hepatosplenomehaly, BS+; abdominal aorta nontender and not dilated  by palpation. Back: no CVA tenderness Pulses 2+ Musculoskeletal: full range of motion, normal strength, no joint deformities Extremities: no clubbing cyanosis or edema, Homan's sign negative  Neurologic: grossly nonfocal; Cranial nerves grossly wnl Psychologic: Normal mood and affect   Studies/Labs Reviewed:   EKG:  EKG is  ordered today.  ECG (independently read by me): Normal sinus rhythm at 65 bpm.  No ectopy.  Normal intervals.  January 30, 2018 ECG (independently read by me): Normal sinus rhythm at 69 bpm.  Nonspecific T wave abnormality inferiorly and in leads V4 through V6.  December 15, 2013 ECG (independently read by me): Normal sinus rhythm at 71 bpm.  Nonspecific T changes in lead III and aVF.  Recent Labs: BMP Latest Ref Rng & Units 02/03/2018 02/18/2014 11/24/2012  Glucose 65 - 99 mg/dL 92 84 91  BUN 6 - 24 mg/dL _0 Creatinine 0.76 - 1.27 mg/dL 0.99 0.90 1.00  BUN/Creat Ratio 9 - 20 15 - -  Sodium 134 - 144 mmol/L 142 138 142  Potassium 3.5 - 5.2 mmol/L 4.4 4.3 4.6  Chloride 96 - 106 mmol/L 107(H) 104 106  CO2 20 - 29 mmol/L _1 Calcium 8.7 - 10.2 mg/dL 8.9 9.2 9.2     Hepatic Function Latest Ref Rng & Units 02/03/2018 02/18/2014 11/24/2012  Total Protein 6.0 - 8.5 g/dL 6.1 6.7 6.5  Albumin 3.5 - 5.5 g/dL 4.0 4.3 4.2  AST 0 - 40 IU/L _2 ALT 0 - 44 IU/L _3 Alk Phosphatase 39 - 117 IU/L 46 55 56  Total Bilirubin 0.0 - 1.2 mg/dL 0.7 1.2 1.0    CBC Latest Ref Rng & Units 02/03/2018 02/18/2014 11/24/2012  WBC 3.4 - 10.8 x10E3/uL 3.7 5.5 5.7  Hemoglobin 13.0 - 17.7 g/dL 13.9 15.4 14.8  Hematocrit 37.5 - 51.0 % 40.5 45.3 41.9  Platelets 150 - 450 x10E3/uL 203 214 200   Lab Results  Component Value Date   MCV 84 02/03/2018   MCV 83.9 02/18/2014   MCV 82.3 11/24/2012   Lab Results  Component Value Date   TSH 0.923 02/03/2018   Lab Results  Component Value Date   HGBA1C 5.7 (H) 02/18/2014     BNP No results found for:  BNP  ProBNP  No results found for: PROBNP   Lipid Panel     Component Value Date/Time   CHOL 140 02/03/2018 0922   CHOL 122 11/24/2012 0817   TRIG 87 02/03/2018 0922   TRIG 84 11/24/2012 0817   HDL 36 (L) 02/03/2018 0922   HDL 40 11/24/2012 0817   CHOLHDL 3.9 02/03/2018 0922   LDLCALC 87 02/03/2018 0922   LDLCALC 65 11/24/2012 0817     RADIOLOGY: Ct Coronary Morph W/cta Cor W/score W/ca W/cm &/or Wo/cm  Addendum Date: 03/07/2018   ADDENDUM REPORT: 03/07/2018 10:45 CLINICAL DATA:  Chest pain EXAM: Cardiac CTA MEDICATIONS: Sub lingual nitro. 10m x 2 TECHNIQUE: The patient was scanned on a Siemens 1025slice scanner. Gantry rotation speed was 250 msecs. Collimation was 0.6 mm. A 100 kV prospective scan was triggered in the ascending thoracic aorta at 35-75% of the R-R interval. Average HR during the scan was 60 bpm. The 3D data set was interpreted on a dedicated work station using MPR, MIP and VRT modes. A total of 80cc of contrast was used. FINDINGS: Non-cardiac: [facility]. Pulmonary veins drain normally to the left atrium. Calcium Score: 157 Agatston units. Coronary Arteries: Right dominant with no anomalies LM: No plaque or stenosis. LAD system: Mixed plaque proximal LAD, mild (<50%) stenosis. Circumflex system: No plaque or stenosis. RCA system: Calcified plaque mid RCA without significant stenosis. IMPRESSION: 1. Coronary artery calcium score 157 Agatston units. This places the patient in the 93rd percentile for age and gender, suggesting high risk for future cardiac events. 2.  Nonobstructive coronary disease. Alexander Booker Electronically Signed   By: DLoralie ChampagneM.D.   On: 03/07/2018 10:45   Result Date: 03/07/2018 EXAM: OVER-READ INTERPRETATION  CT CHEST The following report is an over-read performed by radiologist Dr. DVinnie Langtonof [facility], [place] 03/06/[place]. This over-read does not include interpretation of cardiac or coronary  anatomy or pathology. The coronary calcium score/coronary CTA interpretation by the cardiologist is attached. COMPARISON:  None. FINDINGS: Within the visualized portions of the thorax there are no suspicious appearing pulmonary nodules or masses, there is no acute consolidative airspace disease, no pleural effusions, no pneumothorax and no lymphadenopathy. Visualized portions of the upper abdomen are unremarkable. There are no aggressive appearing lytic or blastic lesions noted in the visualized portions of the skeleton. IMPRESSION: No significant incidental noncardiac findings are noted Electronically Signed: By: DVinnie LangtonM.D. On: 03/06/2018 13:14     Additional studies/ records that were reviewed today include:   ------------------------------------------------------------------- 02/14/2018 ECHO Study Conclusions  - Left ventricle: Moderate basal septal hyprophy. The cavity size   was normal. Systolic function was normal. Wall motion was normal;   there were no regional wall motion abnormalities. Left   ventricular diastolic function parameters were normal. - Atrial septum: No defect or patent foramen ovale was identified.  ASSESSMENT:    1. Coronary artery calcification   2. Hyperlipidemia with target LDL less than 70   3. Exertional dyspnea   4. Family history of premature CAD     PLAN:  Mr. BKase Shughartis a 48year old gentleman who has a strong family history for premature coronary artery disease with multiple family members having suffered initial heart attacks in the 442s  He has a history of hyperlipidemia and remote advanced lipid testing had shown a significant increase in LDL small particless well as insulin resistance and increasedVLDL concordant with an atherogenic dyslipidemic lipid profile.  I had increased his rosuvastatin  to 40 mg and had not seen him for 5 years.  He has recently developed symptoms of exertional shortness of breath particularly with walking uphill  or climbing steps.  He denied significant chest tightness.  He has been on Toprol-XL 50 mg daily when I saw him in January 2020 his ECG revealed sinus rhythm at 69 with mild inferior and inferolateral T wave abnormalities.  Reviewed his echo Doppler study with him in detail which shows normal systolic as well as diastolic function.  I thoroughly reviewed his coronary CTA with him in detail.  Calcium score was 157.  He was felt to have nonobstructive stenoses with narrowing less than 50% in the LAD and less in the mid RCA.  He continues to be on Toprol-XL 50 mg rosuvastatin 40 mg.   With his CTA results I have elected to start low-dose amlodipine initially at 2.5 mg certain he tolerates this from a blood pressure standpoint but if he still experiences vague chest sensation to titrate this to 5 mg daily.  I have suggested he change his Toprol to taking this in the morning take his amlodipine in the evening.  His recent lipid studies revealed a total cholesterol 140 with LDL of 87. Target LDL is less than 70 and with his atherosclerosis I have added Zetia 10 mg to his medical regimen.  In 2 months I am recommending a C met and lipid panel be obtained.  I will see him in the office in follow-up and further recommendations will be made at that time. Medication Adjustments/Labs and Tests Ordered: Current medicines are reviewed at length with the patient today.  Concerns regarding medicines are outlined above.  Medication changes, Labs and Tests ordered today are listed in the Patient Instructions below. Patient Instructions  Medication Instructions:  Start Amlodipine 5 mg. -- start 0.5 tablet (2.5 mg) at night for 1 week, if you still notice issues take the entire tablet.  Start Zetia 10 mg daily. Move Toprol in the morning.   If you need a refill on your cardiac medications before your next appointment, please call your pharmacy.   Lab work: CMET, LIPID in 2 months If you have labs (blood work) drawn today  and your tests are completely normal, you will receive your results only by: Marland Kitchen MyChart Message (if you have MyChart) OR . A paper copy in the mail If you have any lab test that is abnormal or we need to change your treatment, we will call you to review the results.  Follow-Up: At [facility], you and your health needs are our priority.  As part of our continuing mission to provide you with exceptional heart care, we have created designated Provider Care Teams.  These Care Teams include your primary Cardiologist (physician) and Advanced Practice Providers (APPs -  Physician Assistants and Nurse Practitioners) who all work together to provide you with the care you need, when you need it. You will need a follow up appointment in 10 weeks.  Please call our office 2 months in advance to schedule this appointment.  You may see Dr.Kelly or one of the following Advanced Practice Providers on your designated Care Team: Almyra Deforest, [place] . Fabian Sharp, PA-C        Signed, Alexander Majestic, MD  03/21/2018 [facility] [address], [place], [place], [place]  [address] Phone: [phone]

## 2018-03-21 ENCOUNTER — Encounter: Payer: Self-pay | Admitting: Cardiovascular Disease

## 2018-06-03 ENCOUNTER — Telehealth: Payer: Self-pay | Admitting: Cardiovascular Disease

## 2018-06-03 NOTE — Telephone Encounter (Signed)
LVM for patient to call with preference of video or phone visit. °

## 2018-06-04 ENCOUNTER — Telehealth: Payer: Self-pay | Admitting: Cardiovascular Disease

## 2018-06-04 NOTE — Telephone Encounter (Signed)
LVM to inform pt that appt with Dr. Tresa Endo has been rescheduled to Wednesday, 07/23/18 at 12:00 PM. Informed pt that this visit will either need to be video or telephone, and to call back to let us know which he can do.   Also informed pt that he needs to have blood work ordered from last visit done prior to appt on 7/1. Informed that the blood work is to check cholesterol and liver function and is fasting. Informed that our office lab is open and he can come have blood work done, no appointment needed. Also informed to wear a mask when he comes for the blood work.  Asked that he call back with any questions or to reschedule appt if that date and time does not work for him.

## 2018-06-09 ENCOUNTER — Telehealth: Payer: BLUE CROSS/BLUE SHIELD | Admitting: Cardiovascular Disease

## 2018-07-23 ENCOUNTER — Telehealth: Payer: BC Managed Care – PPO | Admitting: Cardiovascular Disease

## 2019-04-27 ENCOUNTER — Other Ambulatory Visit: Payer: Self-pay | Admitting: Cardiovascular Disease

## 2019-06-18 ENCOUNTER — Telehealth: Payer: Self-pay | Admitting: Cardiovascular Disease

## 2019-06-18 NOTE — Telephone Encounter (Signed)
Returned call to patient that called during Epic downtime. Left vm to call back

## 2019-06-23 ENCOUNTER — Other Ambulatory Visit: Payer: Self-pay | Admitting: Cardiovascular Disease

## 2019-06-29 ENCOUNTER — Ambulatory Visit (INDEPENDENT_AMBULATORY_CARE_PROVIDER_SITE_OTHER): Payer: No Typology Code available for payment source | Admitting: Cardiovascular Disease

## 2019-06-29 ENCOUNTER — Encounter: Payer: Self-pay | Admitting: Cardiovascular Disease

## 2019-06-29 ENCOUNTER — Other Ambulatory Visit: Payer: Self-pay

## 2019-06-29 VITALS — BP 118/76 | HR 68 | Ht 69.0 in | Wt 185.8 lb

## 2019-06-29 DIAGNOSIS — I2510 Atherosclerotic heart disease of native coronary artery without angina pectoris: Secondary | ICD-10-CM

## 2019-06-29 DIAGNOSIS — R0600 Dyspnea, unspecified: Secondary | ICD-10-CM | POA: Diagnosis not present

## 2019-06-29 DIAGNOSIS — I2584 Coronary atherosclerosis due to calcified coronary lesion: Secondary | ICD-10-CM

## 2019-06-29 DIAGNOSIS — E785 Hyperlipidemia, unspecified: Secondary | ICD-10-CM | POA: Diagnosis not present

## 2019-06-29 DIAGNOSIS — Z8249 Family history of ischemic heart disease and other diseases of the circulatory system: Secondary | ICD-10-CM | POA: Diagnosis not present

## 2019-06-29 DIAGNOSIS — R0609 Other forms of dyspnea: Secondary | ICD-10-CM

## 2019-06-29 MED ORDER — EZETIMIBE 10 MG PO TABS: 10.0000 mg | ORAL_TABLET | Freq: Every day | ORAL | 3 refills | Status: DC

## 2019-06-29 MED ORDER — ROSUVASTATIN CALCIUM 40 MG PO TABS: 40.0000 mg | ORAL_TABLET | Freq: Every day | ORAL | 11 refills | Status: DC

## 2019-06-29 MED ORDER — METOPROLOL SUCCINATE ER 50 MG PO TB24: 50.0000 mg | ORAL_TABLET | Freq: Every day | ORAL | 11 refills | Status: AC

## 2019-06-29 MED ORDER — AMLODIPINE BESYLATE 2.5 MG PO TABS
2.5000 mg | ORAL_TABLET | Freq: Every day | ORAL | 3 refills | Status: DC
Start: 2019-06-29 — End: 2020-06-08

## 2019-06-29 NOTE — Progress Notes (Signed)
Cardiology Office Note    Date:  07/01/2019   ID:  DAT Alexander Booker, DOB 04-Dec-1970, MRN [id number]  PCP:  Alexander Noon, MD  Cardiologist:  Alexander Majestic, MD    History of Present Illness:  Alexander Booker is a 49 y.o. male who presents to the office today for 59-monthfollow-up cardiology evaluation.  Mr. THollingshas a very strong family history for premature coronary artery disease. His father had suffered initial heart attack at age 9356and underwent 2 CABG revascularization surgeries. Three of 4 of his father's siblings had significant heart problems with one uncle suffering a heart attack in his 412sand  one dying at age 4058with sudden death.  Mr. TGilkeyhas a history of hyperlipidemia. He has been demonstrated to have inferolateral T-wave inversion on his ECG.  In 2010, a cardiac CT was performed and revealed  a calcium score 58 with small nonocclusive plaque that was mixed and calcified in the proximal LAD. A nuclear perfusion study in 2012 showed normal perfusion. He has low HDL levels. In the past he was unable to tolerate Niaspan. [facility]ely had performed a [facility].  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 When I had seen him last year he had lost almost 20 pounds from the year before. He has gained approximately half of that back. He denies chest pain PND orthopnea or shortness of breath.  He underwent an NMR LipoProfile: total cholesterol was 122, triglycerides 84, HDL 40 and LDL cholesterol was 65.  However, he had significant increased small LDL particles at 952 leading to a significant increase in LDL particle number at 1212.  He also was insulin resistant at 65 and had significant increased VLDL size, concordant with an atherogenic dyslipidemic pattern.  Once that lab was complete, I recommended further titration of his Crestor from 20 mg to 40 mg.  He has  tolerated this well without myalgias.  I had not seen him in over 5 years but saw him in February 2020 when he reestablished care with me.  Over the previous 5 years he  continued to do fairly well.  He was traveling extensively with work in [place]Press [place] now works for [employer]  Recently, an employee from work died suddenly at age [age]and another friend of his died with an MI at age 51  He has noticed that in comparison to walking on flat surfaces when he tends to walk uphill or walk up steps he begins to notice exertional shortness of breath which he had not previously experienced.  With recent deaths of close acquaintances he presents now for follow-up cardiology route evaluation.  When I saw him on January 30, 2018  he had noticed a vague sensation in his chest and development of some mild exertional shortness of breath with significant activity.  I scheduled him to undergo an echo Doppler study which was done on January 25, 2018 and showed normal systolic and diastolic function.  He had normal valvular architecture.  I referred him for a coronary CTA which was done on March 07, 2018.  Coronary calcium score was 157 Agatston units placing him in the 93rd percentile for age and gender.  He was found to have mild nonobstructive disease with mixed plaque in the proximal LAD felt to be  mild and less than 50%.  There also was mild calcified plaque without significant stenosis in the mid RCA.  FFR analysis was not done.  When I saw him for follow-up evaluation of March 19, 2018 he still noted some  mild exertional dyspnea with significant exertion.  He denied any other chest discomfort or palpitations,PND or orthopnea.  He denied exertional dyspnea.  With his CTA result I started him on amlodipine 2.5 mg and suggested potential titration of 5 mg depending upon blood pressure.  He also suggested he change his Toprol and take this in the morning with  amlodipine in the evening.  To be more aggressive with lipid management I added Zetia to his statin therapy.  Since I last saw him, he has remained stable and denies chest pain PND orthopnea.  He is unaware of palpitations.  His blood pressure has been stable.  He has not had recent laboratory.  He presents for reevaluation.  Past Medical History:  Diagnosis Date  . Coronary artery disease 3/10   non obsructive Ca++ on CT of [place]  . Dyslipidemia     No past surgical history on file.  Current Medications: Outpatient Medications Prior to Visit  Medication Sig Dispense Refill  . aspirin 81 MG tablet Take 81 mg by mouth daily.    . citalopram (CELEXA) 20 MG tablet Take 20 mg by mouth daily.    Marland Kitchen amLODipine (NORVASC) 5 MG tablet Take 0.5 tablet (2.5 mg) for 1 week. Then transition to a whole tablet. 180 tablet 3  . ezetimibe (ZETIA) 10 MG tablet Take 1 tablet (10 mg total) by mouth daily. 90 tablet 3  . metoprolol succinate (TOPROL-XL) 50 MG 24 hr tablet Take 1 tablet (50 mg total) by mouth daily. 30 tablet 11  . rosuvastatin (CRESTOR) 40 MG tablet Take 1 tablet (40 mg total) by mouth daily. 30 tablet 11   No facility-administered medications prior to visit.     Allergies:   Penicillins and Niacin and related   Social History   Socioeconomic History  . Marital status: Married    Spouse name: Not on file  . Number of children: Not on file  . Years of education: Not on file  . Highest education level: Not on file  Occupational History  . Not on file  Tobacco Use  . Smoking status: Never Smoker  . Smokeless tobacco: Never Used  Substance and Sexual Activity  . Alcohol use: Yes    Alcohol/week: 1.0 standard drinks    Types: 1 drink(s) per week  . Drug use: No  . Sexual activity: Not on file  Other Topics Concern  . Not on file  Social History Narrative  . Not on file   Social Determinants of Health   Financial Resource Strain:   . Difficulty of Paying Living Expenses:     Food Insecurity:   . Worried About Charity fundraiser in the Last Year:   . Arboriculturist in the Last Year:   Transportation Needs:   . Film/video editor (Medical):   Marland Kitchen Lack of Transportation (Non-Medical):   Physical Activity:   . Days of Exercise per Week:   . Minutes of Exercise per Session:   Stress:   . Feeling of Stress :   Social Connections:   . Frequency of Communication with Friends and Family:   . Frequency of Social Gatherings with Friends and Family:   . Attends Religious Services:   . Active Member  of Clubs or Organizations:   . Attends Archivist Meetings:   Marland Kitchen Marital Status:      Family History:  The patient's family history includes Coronary artery disease (age of onset: 68) in his father; Heart attack in his father.   ROS General: Negative; No fevers, chills, or night sweats;  HEENT: Negative; No changes in vision or hearing, sinus congestion, difficulty swallowing Pulmonary: Negative; No cough, wheezing, shortness of breath, hemoptysis Cardiovascular: Positive for exertional shortness of breath with uphill walking or climbing steps; GI: Negative; No nausea, vomiting, diarrhea, or abdominal pain GU: Negative; No dysuria, hematuria, or difficulty voiding Musculoskeletal: Negative; no myalgias, joint pain, or weakness Hematologic/Oncology: Negative; no easy bruising, bleeding Endocrine: Negative; no heat/cold intolerance; no diabetes Neuro: Negative; no changes in balance, headaches Skin: Negative; No rashes or skin lesions Psychiatric: Negative; No behavioral problems, depression Sleep: Negative; No snoring, daytime sleepiness, hypersomnolence, bruxism, restless legs, hypnogognic hallucinations, no cataplexy Other comprehensive 14 point system review is negative.   PHYSICAL EXAM:   VS:  BP 118/76   Pulse 68   Ht _0  (1.753 m)   Wt 185 lb 12.8 oz (84.3 kg)   BMI 27.44 kg/m    Repeat blood pressure by 160/74 supine 112/70  standing  Wt Readings from Last 3 Encounters:  06/29/19 185 lb 12.8 oz (84.3 kg)  03/19/18 182 lb 3.2 oz (82.6 kg)  01/30/18 180 lb 12.8 oz (82 kg)    General: Alert, oriented, no distress.  Skin: normal turgor, no rashes, warm and dry HEENT: Normocephalic, atraumatic. Pupils equal round and reactive to light; sclera anicteric; extraocular muscles intact;  Nose without nasal septal hypertrophy Mouth/Parynx benign; Mallinpatti scale 2 Neck: No JVD, no carotid bruits; normal carotid upstroke Lungs: clear to ausculatation and percussion; no wheezing or rales Chest wall: without tenderness to palpitation Heart: PMI not displaced, RRR, s1 s2 normal, 1/6 systolic murmur, no diastolic murmur, no rubs, gallops, thrills, or heaves Abdomen: soft, nontender; no hepatosplenomehaly, BS+; abdominal aorta nontender and not dilated by palpation. Back: no CVA tenderness Pulses 2+ Musculoskeletal: full range of motion, normal strength, no joint deformities Extremities: no clubbing cyanosis or edema, Homan's sign negative  Neurologic: grossly nonfocal; Cranial nerves grossly wnl Psychologic: Normal mood and affect   Studies/Labs Reviewed:   EKG:  EKG is  ordered today.  ECG (independently read by me): NSR at 68, nonspecific T changes inferiorly; no ectopy; normal intervals  March 19, 2018 ECG (independently read by me): Normal sinus rhythm at 65 bpm.  No ectopy.  Normal intervals.  January 30, 2018 ECG (independently read by me): Normal sinus rhythm at 69 bpm.  Nonspecific T wave abnormality inferiorly and in leads V4 through V6.  December 15, 2013 ECG (independently read by me): Normal sinus rhythm at 71 bpm.  Nonspecific T changes in lead III and aVF.  Recent Labs: BMP Latest Ref Rng & Units 02/03/2018 02/18/2014 11/24/2012  Glucose 65 - 99 mg/dL 92 84 91  BUN 6 - 24 mg/dL _1 Creatinine 0.76 - 1.27 mg/dL 0.99 0.90 1.00  BUN/Creat Ratio 9 - 20 15 - -  Sodium 134 - 144 mmol/L 142 138 142   Potassium 3.5 - 5.2 mmol/L 4.4 4.3 4.6  Chloride 96 - 106 mmol/L 107(H) 104 106  CO2 20 - 29 mmol/L _2 Calcium 8.7 - 10.2 mg/dL 8.9 9.2 9.2     Hepatic Function Latest Ref Rng & Units 02/03/2018 02/18/2014 11/24/2012  Total Protein 6.0 - 8.5 g/dL 6.1 6.7 6.5  Albumin 3.5 - 5.5 g/dL 4.0 4.3 4.2  AST 0 - 40 IU/L _0 ALT 0 - 44 IU/L _1 Alk Phosphatase 39 - 117 IU/L 46 55 56  Total Bilirubin 0.0 - 1.2 mg/dL 0.7 1.2 1.0    CBC Latest Ref Rng & Units 02/03/2018 02/18/2014 11/24/2012  WBC 3.4 - 10.8 x10E3/uL 3.7 5.5 5.7  Hemoglobin 13.0 - 17.7 g/dL 13.9 15.4 14.8  Hematocrit 37.5 - 51.0 % 40.5 45.3 41.9  Platelets 150 - 450 x10E3/uL 203 214 200   Lab Results  Component Value Date   MCV 84 02/03/2018   MCV 83.9 02/18/2014   MCV 82.3 11/24/2012   Lab Results  Component Value Date   TSH 0.923 02/03/2018   Lab Results  Component Value Date   HGBA1C 5.7 (H) 02/18/2014     BNP No results found for: BNP  ProBNP No results found for: PROBNP   Lipid Panel     Component Value Date/Time   CHOL 140 02/03/2018 0922   CHOL 122 11/24/2012 0817   TRIG 87 02/03/2018 0922   TRIG 84 11/24/2012 0817   HDL 36 (L) 02/03/2018 0922   HDL 40 11/24/2012 0817   CHOLHDL 3.9 02/03/2018 0922   LDLCALC 87 02/03/2018 0922   LDLCALC 65 11/24/2012 0817     RADIOLOGY: No results found.   Additional studies/ records that were reviewed today include:   ------------------------------------------------------------------- 02/14/2018 ECHO Study Conclusions  - Left ventricle: Moderate basal septal hyprophy. The cavity size   was normal. Systolic function was normal. Wall motion was normal;   there were no regional wall motion abnormalities. Left   ventricular diastolic function parameters were normal. - Atrial septum: No defect or patent foramen ovale was identified.  ASSESSMENT:    1. Coronary artery calcification   2. Hyperlipidemia with target LDL less than 70   3.  Exertional dyspnea   4. Family history of premature CAD     PLAN:  Mr. Guiseppe Flanagan is a 49 year-old gentleman who has a strong family history for premature coronary artery disease with multiple family members having suffered initial heart attacks in the 18s.  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  I had increased his rosuvastatin to 40 mg and had not seen him for 5 years.  When I saw him in 2020 he had developed mild exertional  shortness of breath particularly with walking uphill or climbing steps.  He denied significant chest tightness.  He has been on Toprol-XL 50 mg daily when I saw him in January 2020 his ECG revealed sinus rhythm at 69 with mild inferior and inferolateral T wave abnormalities.  An echo Doppler demonstrated normal systolic as well as diastolic function.  I thoroughly reviewed his coronary CTA with him in detail.  Calcium score was 157.  He was felt to have nonobstructive stenoses with narrowing less than 50% in the LAD and less in the mid RCA.  He continues to be on Toprol-XL 50 mg rosuvastatin 40 mg.  With his CTA results I added low-dose amlodipine for more optimal blood pressure control and potential anti-ischemic benefit.  I also added Zetia to his rosuvastatin for more aggressive lipid-lowering therapy.  His blood pressure today is excellent and there is no  orthostatic change.  He continues to be on aspirin 81 mg and is tolerating this well.  Blood pressure is stable on Toprol-XL 50 mg in the morning and amlodipine 2.5 mg in the evening.  I will recheck a complete set of laboratory including comprehensive metabolic panel, CBC, TSH and lipid studies.  I will contact him regarding results and adjustments will be made to his medical regimen if necessary.  As long as he remains stable I will see him  in 1 year for reevaluation.  Medication Adjustments/Labs and Tests Ordered: Current medicines are reviewed at length with the patient today.  Concerns regarding medicines are outlined above.  Medication changes, Labs and Tests ordered today are listed in the Patient Instructions below. Patient Instructions  Medication Instructions:  REFILLS ON MEDICATIONS SENT  *If you need a refill on your cardiac medications before your next appointment, please call your pharmacy*   Lab Work: FASTING LAB WORK: CMET CBC TSH LIPID If you have labs (blood work) drawn today and your tests are completely normal, you will receive your results only by: Marland Kitchen MyChart Message (if you have MyChart) OR . A paper copy in the mail If you have any lab test that is abnormal or we need to change your treatment, we will call you to review the results.   Follow-Up: At [facility], you and your health needs are our priority.  As part of our continuing mission to provide you with exceptional heart care, we have created designated Provider Care Teams.  These Care Teams include your primary Cardiologist (physician) and Advanced Practice Providers (APPs -  Physician Assistants and Nurse Practitioners) who all work together to provide you with the care you need, when you need it.  We recommend signing up for the patient portal called "MyChart".  Sign up information is provided on this After Visit Summary.  MyChart is used to connect with patients for Virtual Visits (Telemedicine).  Patients are able to view lab/test results, encounter notes, upcoming appointments, etc.  Non-urgent messages can be sent to your provider as well.   To learn more about what you can do with MyChart, go to [email or web address].    Your next appointment:   12 month(s)  The format for your next appointment:   In Person  Provider:   Shelva Majestic, MD       Signed, Alexander Majestic, MD  07/01/2019 10:18 PM    [place] [address], [place], [place], [place]  [address] Phone: [phone]

## 2019-06-29 NOTE — Patient Instructions (Signed)
Medication Instructions:  REFILLS ON MEDICATIONS SENT  *If you need a refill on your cardiac medications before your next appointment, please call your pharmacy*   Lab Work: FASTING LAB WORK: CMET CBC TSH LIPID If you have labs (blood work) drawn today and your tests are completely normal, you will receive your results only by: Marland Kitchen MyChart Message (if you have MyChart) OR . A paper copy in the mail If you have any lab test that is abnormal or we need to change your treatment, we will call you to review the results.   Follow-Up: At [facility], you and your health needs are our priority.  As part of our continuing mission to provide you with exceptional heart care, we have created designated Provider Care Teams.  These Care Teams include your primary Cardiologist (physician) and Advanced Practice Providers (APPs -  Physician Assistants and Nurse Practitioners) who all work together to provide you with the care you need, when you need it.  We recommend signing up for the patient portal called "MyChart".  Sign up information is provided on this After Visit Summary.  MyChart is used to connect with patients for Virtual Visits (Telemedicine).  Patients are able to view lab/test results, encounter notes, upcoming appointments, etc.  Non-urgent messages can be sent to your provider as well.   To learn more about what you can do with MyChart, go to [email or web address].    Your next appointment:   12 month(s)  The format for your next appointment:   In Person  Provider:   Nicki Guadalajara, MD

## 2019-07-01 ENCOUNTER — Encounter: Payer: Self-pay | Admitting: Cardiovascular Disease

## 2019-07-10 LAB — CBC
Hematocrit: 43.2 % (ref 37.5–51.0)
Hemoglobin: 14.8 g/dL (ref 13.0–17.7)
MCH: 28.7 pg (ref 26.6–33.0)
MCHC: 34.3 g/dL (ref 31.5–35.7)
MCV: 84 fL (ref 79–97)
Platelets: 212 10*3/uL (ref 150–450)
RBC: 5.16 x10E6/uL (ref 4.14–5.80)
RDW: 12.6 % (ref 11.6–15.4)
WBC: 5 10*3/uL (ref 3.4–10.8)

## 2019-07-10 LAB — COMPREHENSIVE METABOLIC PANEL
ALT: 27 IU/L (ref 0–44)
AST: 26 IU/L (ref 0–40)
Albumin/Globulin Ratio: 1.7 (ref 1.2–2.2)
Albumin: 4.1 g/dL (ref 4.0–5.0)
Alkaline Phosphatase: 68 IU/L (ref 48–121)
BUN/Creatinine Ratio: 10 (ref 9–20)
BUN: 11 mg/dL (ref 6–24)
Bilirubin Total: 0.9 mg/dL (ref 0.0–1.2)
CO2: 24 mmol/L (ref 20–29)
Calcium: 9.4 mg/dL (ref 8.7–10.2)
Chloride: 106 mmol/L (ref 96–106)
Creatinine, Ser: 1.1 mg/dL (ref 0.76–1.27)
GFR calc Af Amer: 90 mL/min/{1.73_m2} (ref >59)
GFR calc non Af Amer: 78 mL/min/{1.73_m2} (ref >59)
Globulin, Total: 2.4 g/dL (ref 1.5–4.5)
Glucose: 85 mg/dL (ref 65–99)
Potassium: 5.1 mmol/L (ref 3.5–5.2)
Sodium: 141 mmol/L (ref 134–144)
Total Protein: 6.5 g/dL (ref 6.0–8.5)

## 2019-07-10 LAB — LIPID PANEL
Chol/HDL Ratio: 3 ratio (ref 0.0–5.0)
Cholesterol, Total: 112 mg/dL (ref 100–199)
HDL: 37 mg/dL — ABNORMAL LOW (ref >39)
LDL Chol Calc (NIH): 56 mg/dL (ref 0–99)
Triglycerides: 103 mg/dL (ref 0–149)
VLDL Cholesterol Cal: 19 mg/dL (ref 5–40)

## 2019-07-10 LAB — TSH: TSH: 0.878 u[IU]/mL (ref 0.450–4.500)

## 2020-05-03 ENCOUNTER — Other Ambulatory Visit: Payer: Self-pay | Admitting: Cardiovascular Disease

## 2020-06-08 ENCOUNTER — Other Ambulatory Visit: Payer: Self-pay | Admitting: Cardiovascular Disease

## 2020-06-21 ENCOUNTER — Other Ambulatory Visit: Payer: Self-pay | Admitting: Cardiovascular Disease

## 2020-07-18 ENCOUNTER — Other Ambulatory Visit: Payer: Self-pay | Admitting: Cardiovascular Disease

## 2020-08-20 ENCOUNTER — Other Ambulatory Visit: Payer: Self-pay | Admitting: Cardiovascular Disease

## 2020-09-05 ENCOUNTER — Other Ambulatory Visit: Payer: Self-pay | Admitting: Cardiovascular Disease

## 2020-09-19 ENCOUNTER — Other Ambulatory Visit: Payer: Self-pay | Admitting: Cardiovascular Disease

## 2020-09-19 ENCOUNTER — Other Ambulatory Visit: Payer: Self-pay

## 2020-09-19 MED ORDER — ROSUVASTATIN CALCIUM 40 MG PO TABS: 40.0000 mg | ORAL_TABLET | Freq: Every day | ORAL | 0 refills | Status: DC

## 2020-09-23 ENCOUNTER — Telehealth: Payer: Self-pay | Admitting: Cardiovascular Disease

## 2020-09-23 MED ORDER — ROSUVASTATIN CALCIUM 40 MG PO TABS: 40.0000 mg | ORAL_TABLET | Freq: Every day | ORAL | 3 refills | Status: DC

## 2020-09-23 NOTE — Telephone Encounter (Signed)
*  STAT* If patient is at the pharmacy, call can be transferred to refill team.   1. Which medications need to be refilled? (please list name of each medication and dose if known)  rosuvastatin (CRESTOR) 40 MG tablet  2. Which pharmacy/location (including street and city if local pharmacy) is medication to be sent to? [facility] - [place], [place] - [address]  3. Do they need a 30 day or 90 day supply? 30 with refills   Patient is scheduled to see Dr. Tresa Endo 01/24/21. The pharmacy is only giving the patient 14 days at a time. He would like the office to send in a full 30 day rx with refills so he does not have to keep calling the office

## 2020-09-23 NOTE — Telephone Encounter (Signed)
Spoke to patient letting him know that I sent in enough refills until his January appointment with Dr. Tresa Endo. I also let him know that it is important to make sure that he keeps his January appointment in order for [place] to continue to refill his cardiac medications.

## 2020-12-09 ENCOUNTER — Other Ambulatory Visit: Payer: Self-pay | Admitting: Cardiovascular Disease

## 2021-01-02 ENCOUNTER — Other Ambulatory Visit: Payer: Self-pay | Admitting: Cardiovascular Disease

## 2021-01-24 ENCOUNTER — Encounter: Payer: Self-pay | Admitting: Cardiovascular Disease

## 2021-01-24 ENCOUNTER — Ambulatory Visit: Payer: BC Managed Care – PPO | Admitting: Cardiovascular Disease

## 2021-01-24 ENCOUNTER — Other Ambulatory Visit: Payer: Self-pay

## 2021-01-24 DIAGNOSIS — E785 Hyperlipidemia, unspecified: Secondary | ICD-10-CM | POA: Diagnosis not present

## 2021-01-24 DIAGNOSIS — I10 Essential (primary) hypertension: Secondary | ICD-10-CM

## 2021-01-24 DIAGNOSIS — I2510 Atherosclerotic heart disease of native coronary artery without angina pectoris: Secondary | ICD-10-CM | POA: Diagnosis not present

## 2021-01-24 DIAGNOSIS — Z79899 Other long term (current) drug therapy: Secondary | ICD-10-CM | POA: Diagnosis not present

## 2021-01-24 DIAGNOSIS — Z8249 Family history of ischemic heart disease and other diseases of the circulatory system: Secondary | ICD-10-CM

## 2021-01-24 DIAGNOSIS — I2584 Coronary atherosclerosis due to calcified coronary lesion: Secondary | ICD-10-CM | POA: Diagnosis not present

## 2021-01-24 NOTE — Patient Instructions (Addendum)
Medication Instructions:  The current medical regimen is effective;  continue present plan and medications as directed. Please refer to the Current Medication list given to you today.   *If you need a refill on your cardiac medications before your next appointment, please call your pharmacy*  Lab Work:    FASTING LIPID, CBC , CMET LIPID AND Lpa WHEN FASTING      Special Instructions OK FOR SURGERY  Follow-Up: Your next appointment:  6 month(s) In Person with Nicki Guadalajara, MD   Please call our office 2 months in advance to schedule this appointment   At [facility], you and your health needs are our priority.  As part of our continuing mission to provide you with exceptional heart care, we have created designated Provider Care Teams.  These Care Teams include your primary Cardiologist (physician) and Advanced Practice Providers (APPs -  Physician Assistants and Nurse Practitioners) who all work together to provide you with the care you need, when you need it.

## 2021-01-24 NOTE — Progress Notes (Signed)
Cardiology Office Note    Date:  01/25/2021   ID:  Alexander Booker, DOB February 29, 1970, MRN [id number]  PCP:  Alexander Noon, MD  Cardiologist:  Alexander Majestic, MD    History of Present Illness:  Alexander Booker is a 51 y.o. male who presents to the office today for 58 - month follow-up cardiology evaluation.  [name] has a very strong family history for premature coronary artery disease. His father had suffered initial heart attack at age 38 and underwent 2 CABG revascularization surgeries. Three of 4 of his father's siblings had significant heart problems with one uncle suffering a heart attack in his 46s and  one dying at age 64 with sudden death.   [name] has a history of hyperlipidemia. He has been demonstrated to have inferolateral T-wave inversion on his ECG.  In 2010, a cardiac CT was performed and revealed  a calcium score 58 with small nonocclusive plaque that was mixed and calcified in the proximal LAD. A nuclear perfusion study in 2012 showed normal perfusion. He has low HDL levels. In the past he was unable to tolerate Niaspan. Remotely had performed a [place] lab assessment.   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 This has reduced the amount of exercise that he had been able to do. When I had seen him last year he had lost almost 20 pounds from the year before. He has gained approximately half of that back. He denies chest pain PND orthopnea or shortness of breath.   He underwent an NMR LipoProfile: total cholesterol was 122, triglycerides 84, HDL 40 and LDL cholesterol was 65.  However, he had significant increased small LDL particles at 952 leading to a significant increase in LDL particle number at 1212.  He also was insulin resistant at 65 and had significant increased VLDL size, concordant with an atherogenic dyslipidemic pattern.  Once that lab was complete, I recommended further titration of his Crestor from 20 mg to 40 mg.  He has  tolerated this well without myalgias.  I had not seen him in over 5 years but saw him in February 2020 when he reestablished care with me.  Over the previous 5 years he  continued to do fairly well.  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  Recently, an employee from work died suddenly at age 50 and another friend of his died with an MI at age 30.  He has noticed that in comparison to walking on flat surfaces when he tends to walk uphill or walk up steps he begins to notice exertional shortness of breath which he had not previously experienced.  With recent deaths of close acquaintances he presents now for follow-up cardiology route evaluation.  When I saw him on January 30, 2018  he had noticed a vague sensation in his chest and development of some mild exertional shortness of breath with significant activity.  I scheduled him to undergo an echo Doppler study which was done on January 25, 2018 and showed normal systolic and diastolic function.  He had normal valvular architecture.  I referred him for a coronary CTA which was done on March 07, 2018.  Coronary calcium score was 157 Agatston units placing him in the 93rd percentile for age and gender.  He was found to have mild nonobstructive disease with mixed plaque in the  proximal LAD felt to be mild and less than 50%.  There also was mild calcified plaque without significant stenosis in the mid RCA.  FFR analysis was not done.  When I saw him for follow-up evaluation of March 19, 2018 he still noted some  mild exertional dyspnea with significant exertion.  He denied any other chest discomfort or palpitations,PND or orthopnea.  He denied exertional dyspnea.  With his CTA result I started him on amlodipine 2.5 mg and suggested potential titration of 5 mg depending upon blood pressure.  He also suggested he change his Toprol and take this in the morning with  amlodipine in the evening.  To be more aggressive with lipid management I added Zetia to his statin therapy.  I last saw him on June 29, 2019.  At that time he remained stable and denied any chest pain, PND orthopnea and was unaware any palpitations.     Since his last evaluation with me, he continues to be only mildly active but states he has not been as active as he had in the past.  He has a lingering right inguinal hernia and is tentatively scheduled to have surgery on March 14, 2021 by Dr. Armandina Gemma.  He denies any chest pain.  However because he has not been very active he admits to perhaps getting more winded if he overexerts himself.  He denies any tightness or pressure.  He is unaware of associated palpitations.  He has continued to be on metoprolol succinate 50 mg daily and amlodipine 2.5 mg daily for blood pressure control.  He is on Zetia 10 mg and maximum dose rosuvastatin 40 mg for hyperlipidemia.  He has not had recent laboratory but LDL cholesterol in June 2021 was 56, total cholesterol 112, HDL cholesterol 37 and triglycerides 103.  Creatinine was 1.1.  He presents for evaluation.   Past Medical History:  Diagnosis Date   Coronary artery disease 3/10   non obsructive Ca++ on CT of [place]   Dyslipidemia     No past surgical history on file.  Current Medications: Outpatient Medications Prior to Visit  Medication Sig Dispense Refill   amLODipine (NORVASC) 2.5 MG tablet TAKE 1 TABLET BY MOUTH EVERY DAY 90 tablet 3   aspirin 81 MG tablet Take 81 mg by mouth daily.     citalopram (CELEXA) 20 MG tablet Take 20 mg by mouth daily.     ezetimibe (ZETIA) 10 MG tablet TAKE 1 TABLET BY MOUTH EVERY DAY. need appointment FOR future refills 90 tablet 0   metoprolol succinate (TOPROL-XL) 50 MG 24 hr tablet Take 1 tablet (50 mg total) by mouth daily. 30 tablet 11   rosuvastatin (CRESTOR) 40 MG tablet TAKE 1 TABLET BY MOUTH EVERY DAY -keep appointment in january 30 tablet 3   No  facility-administered medications prior to visit.     Allergies:   Penicillins and Niacin and related   Social History   Socioeconomic History   Marital status: Married    Spouse name: Not on file   Number of children: Not on file   Years of education: Not on file   Highest education level: Not on file  Occupational History   Not on file  Tobacco Use   Smoking status: Never   Smokeless tobacco: Never  Substance and Sexual Activity   Alcohol use: Yes    Alcohol/week: 1.0 standard drink    Types: 1 drink(s) per week   Drug use: No   Sexual activity: Not  on file  Other Topics Concern   Not on file  Social History Narrative   Not on file   Social Determinants of Health   Financial Resource Strain: Not on file  Food Insecurity: Not on file  Transportation Needs: Not on file  Physical Activity: Not on file  Stress: Not on file  Social Connections: Not on file     Family History:  The patient's family history includes Coronary artery disease (age of onset: 64) in his father; Heart attack in his father.   ROS General: Negative; No fevers, chills, or night sweats;  HEENT: Negative; No changes in vision or hearing, sinus congestion, difficulty swallowing Pulmonary: Negative; No cough, wheezing, shortness of breath, hemoptysis Cardiovascular: Positive for exertional shortness of breath with uphill walking or climbing steps; GI: Negative; No nausea, vomiting, diarrhea, or abdominal pain GU: Negative; No dysuria, hematuria, or difficulty voiding Musculoskeletal: Negative; no myalgias, joint pain, or weakness Hematologic/Oncology: Negative; no easy bruising, bleeding Endocrine: Negative; no heat/cold intolerance; no diabetes Neuro: Negative; no changes in balance, headaches Skin: Negative; No rashes or skin lesions Psychiatric: Negative; No behavioral problems, depression Sleep: Negative; No snoring, daytime sleepiness, hypersomnolence, bruxism, restless legs, hypnogognic  hallucinations, no cataplexy Other comprehensive 14 point system review is negative.   PHYSICAL EXAM:   VS:  BP 114/72    Pulse (!) 59    Ht _0  (1.753 m)    Wt 186 lb 12.8 oz (84.7 kg)    SpO2 100%    BMI 27.59 kg/m    Repeat blood pressure by me was 120/72  Wt Readings from Last 3 Encounters:  01/24/21 186 lb 12.8 oz (84.7 kg)  06/29/19 185 lb 12.8 oz (84.3 kg)  03/19/18 182 lb 3.2 oz (82.6 kg)    General: Alert, oriented, no distress.  Skin: normal turgor, no rashes, warm and dry HEENT: Normocephalic, atraumatic. Pupils equal round and reactive to light; sclera anicteric; extraocular muscles intact;  Nose without nasal septal hypertrophy Mouth/Parynx benign; Mallinpatti scale 2 Neck: No JVD, no carotid bruits; normal carotid upstroke Lungs: clear to ausculatation and percussion; no wheezing or rales Chest wall: without tenderness to palpitation Heart: PMI not displaced, RRR, s1 s2 normal, 1/6 systolic murmur, no diastolic murmur, no rubs, gallops, thrills, or heaves Abdomen: soft, nontender; no hepatosplenomehaly, BS+; abdominal aorta nontender and not dilated by palpation. Back: no CVA tenderness Pulses 2+ Musculoskeletal: full range of motion, normal strength, no joint deformities Extremities: no clubbing cyanosis or edema, Homan's sign negative  Neurologic: grossly nonfocal; Cranial nerves grossly wnl Psychologic: Normal mood and affect   Studies/Labs Reviewed:   January 24, 2021 ECG (independently read by me):  Sinus bradycardia at 59; PR 202 msesc  June 29, 2019 ECG (independently read by me): NSR at 68, nonspecific T changes inferiorly; no ectopy; normal intervals  March 19, 2018 ECG (independently read by me): Normal sinus rhythm at 65 bpm.  No ectopy.  Normal intervals.  January 30, 2018 ECG (independently read by me): Normal sinus rhythm at 69 bpm.  Nonspecific T wave abnormality inferiorly and in leads V4 through V6.  December 15, 2013 ECG (independently read  by me): Normal sinus rhythm at 71 bpm.  Nonspecific T changes in lead III and aVF.  Recent Labs: BMP Latest Ref Rng & Units 07/10/2019 02/03/2018 02/18/2014  Glucose 65 - 99 mg/dL 85 92 84  BUN 6 - 24 mg/dL _1 Creatinine 0.76 - 1.27 mg/dL 1.10 0.99 0.90  BUN/Creat Ratio  9 - _0 -  Sodium 134 - 144 mmol/L 141 142 138  Potassium 3.5 - 5.2 mmol/L 5.1 4.4 4.3  Chloride 96 - 106 mmol/L 106 107(H) 104  CO2 20 - 29 mmol/L _1 Calcium 8.7 - 10.2 mg/dL 9.4 8.9 9.2     Hepatic Function Latest Ref Rng & Units 07/10/2019 02/03/2018 02/18/2014  Total Protein 6.0 - 8.5 g/dL 6.5 6.1 6.7  Albumin 4.0 - 5.0 g/dL 4.1 4.0 4.3  AST 0 - 40 IU/L _2 ALT 0 - 44 IU/L _3 Alk Phosphatase 48 - 121 IU/L 68 46 55  Total Bilirubin 0.0 - 1.2 mg/dL 0.9 0.7 1.2    CBC Latest Ref Rng & Units 07/10/2019 02/03/2018 02/18/2014  WBC 3.4 - 10.8 x10E3/uL 5.0 3.7 5.5  Hemoglobin 13.0 - 17.7 g/dL 14.8 13.9 15.4  Hematocrit 37.5 - 51.0 % 43.2 40.5 45.3  Platelets 150 - 450 x10E3/uL 212 203 214   Lab Results  Component Value Date   MCV 84 07/10/2019   MCV 84 02/03/2018   MCV 83.9 02/18/2014   Lab Results  Component Value Date   TSH 0.878 07/10/2019   Lab Results  Component Value Date   HGBA1C 5.7 (H) 02/18/2014     BNP No results found for: BNP  ProBNP No results found for: PROBNP   Lipid Panel     Component Value Date/Time   CHOL 112 07/10/2019 0931   CHOL 122 11/24/2012 0817   TRIG 103 07/10/2019 0931   TRIG 84 11/24/2012 0817   HDL 37 (L) 07/10/2019 0931   HDL 40 11/24/2012 0817   CHOLHDL 3.0 07/10/2019 0931   LDLCALC 56 07/10/2019 0931   LDLCALC 65 11/24/2012 0817     RADIOLOGY: No results found.   Additional studies/ records that were reviewed today include:   ------------------------------------------------------------------- 02/14/2018 ECHO Study Conclusions   - Left ventricle: Moderate basal septal hyprophy. The cavity size   was normal. Systolic  function was normal. Wall motion was normal;   there were no regional wall motion abnormalities. Left   ventricular diastolic function parameters were normal. - Atrial septum: No defect or patent foramen ovale was identified.  ASSESSMENT:    1. Essential hypertension   2. Coronary artery calcification   3. Hyperlipidemia with target LDL less than 70   4. Medication management   5. Family history of premature CAD     PLAN:  Mr. Laurie Lovejoy is a 51 year-old gentleman who has a strong family history for premature coronary artery disease with multiple family members having suffered initial heart attacks in the 8s.  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  I had increased his rosuvastatin to 40 mg and had not seen him for 5 years.  When I saw him in 2020 he had developed mild exertional  shortness of breath particularly with walking uphill or climbing steps.  He denied significant chest tightness.  He has been on Toprol-XL 50 mg daily when I saw him in January 2020 his ECG revealed sinus rhythm at 69 with mild inferior and inferolateral T wave abnormalities.  An echo Doppler demonstrated normal systolic as well as diastolic function.  I thoroughly reviewed his coronary CTA with him in detail.  Calcium score was 157.  He was felt to  have nonobstructive stenoses with narrowing less than 50% in the LAD and less in the mid RCA.  He continues to be on Toprol-XL 50 mg rosuvastatin 40 mg.  With his CTA results I added low-dose amlodipine for more optimal blood pressure control and potential anti-ischemic benefit.  I also added Zetia to his rosuvastatin for more aggressive lipid-lowering therapy.  Presently, he continues to feel well.  He has not had recent laboratory I will check a comprehensive metabolic panel, TSH, CBC,  fasting lipid panel, as well as a LP(a).  Blood pressure today continues to be excellent on his regimen of amlodipine 2.5 mg, metoprolol succinate 50 mg daily.  His pulse is 59 and regular.  He is tolerating combined Zetia and rosuvastatin.  I discussed resumption of activity for potential improved aerobic capacity.  I believe he is stable to undergo his upcoming inguinal hernia surgery scheduled for March 14, 2021.   Medication Adjustments/Labs and Tests Ordered: Current medicines are reviewed at length with the patient today.  Concerns regarding medicines are outlined above.  Medication changes, Labs and Tests ordered today are listed in the Patient Instructions below. Patient Instructions  Medication Instructions:  The current medical regimen is effective;  continue present plan and medications as directed. Please refer to the Current Medication list given to you today.   *If you need a refill on your cardiac medications before your next appointment, please call your pharmacy*  Lab Work:    FASTING LIPID, CBC , CMET LIPID AND Lpa WHEN FASTING      Special Instructions OK FOR SURGERY  Follow-Up: Your next appointment:  6 month(s) In Person with Alexander Majestic, MD   Please call our office 2 months in advance to schedule this appointment   At [facility], you and your health needs are our priority.  As part of our continuing mission to provide you with exceptional heart care, we have created designated Provider Care Teams.  These Care Teams include your primary Cardiologist (physician) and Advanced Practice Providers (APPs -  Physician Assistants and Nurse Practitioners) who all work together to provide you with the care you need, when you need it.     Signed, Alexander Majestic, MD  01/25/2021 5:14 PM    [facility] [address], [place], [place], [place]  [address] Phone: [phone]

## 2021-01-25 ENCOUNTER — Encounter: Payer: Self-pay | Admitting: Cardiovascular Disease

## 2021-01-27 LAB — CBC
Hematocrit: 43.9 % (ref 37.5–51.0)
Hemoglobin: 15.2 g/dL (ref 13.0–17.7)
MCH: 28.9 pg (ref 26.6–33.0)
MCHC: 34.6 g/dL (ref 31.5–35.7)
MCV: 84 fL (ref 79–97)
Platelets: 235 10*3/uL (ref 150–450)
RBC: 5.26 x10E6/uL (ref 4.14–5.80)
RDW: 12.2 % (ref 11.6–15.4)
WBC: 5.3 10*3/uL (ref 3.4–10.8)

## 2021-01-27 LAB — COMPREHENSIVE METABOLIC PANEL
ALT: 34 IU/L (ref 0–44)
AST: 33 IU/L (ref 0–40)
Albumin/Globulin Ratio: 2.5 — ABNORMAL HIGH (ref 1.2–2.2)
Albumin: 4.5 g/dL (ref 3.8–4.9)
Alkaline Phosphatase: 56 IU/L (ref 44–121)
BUN/Creatinine Ratio: 11 (ref 9–20)
BUN: 11 mg/dL (ref 6–24)
Bilirubin Total: 1 mg/dL (ref 0.0–1.2)
CO2: 25 mmol/L (ref 20–29)
Calcium: 9.1 mg/dL (ref 8.7–10.2)
Chloride: 105 mmol/L (ref 96–106)
Creatinine, Ser: 0.97 mg/dL (ref 0.76–1.27)
Globulin, Total: 1.8 g/dL (ref 1.5–4.5)
Glucose: 88 mg/dL (ref 70–99)
Potassium: 4.8 mmol/L (ref 3.5–5.2)
Sodium: 142 mmol/L (ref 134–144)
Total Protein: 6.3 g/dL (ref 6.0–8.5)
eGFR: 94 mL/min/{1.73_m2} (ref >59)

## 2021-01-27 LAB — LIPID PANEL
Chol/HDL Ratio: 2.9 ratio (ref 0.0–5.0)
Cholesterol, Total: 110 mg/dL (ref 100–199)
HDL: 38 mg/dL — ABNORMAL LOW (ref >39)
LDL Chol Calc (NIH): 52 mg/dL (ref 0–99)
Triglycerides: 108 mg/dL (ref 0–149)
VLDL Cholesterol Cal: 20 mg/dL (ref 5–40)

## 2021-01-27 LAB — TSH: TSH: 0.963 u[IU]/mL (ref 0.450–4.500)

## 2021-01-27 LAB — LIPOPROTEIN A (LPA): Lipoprotein (a): 196 nmol/L — ABNORMAL HIGH (ref <75.0)

## 2021-03-21 ENCOUNTER — Other Ambulatory Visit: Payer: Self-pay | Admitting: Cardiovascular Disease

## 2021-05-06 ENCOUNTER — Other Ambulatory Visit: Payer: Self-pay | Admitting: Cardiovascular Disease

## 2021-07-03 ENCOUNTER — Other Ambulatory Visit: Payer: Self-pay | Admitting: Cardiovascular Disease

## 2021-07-12 ENCOUNTER — Ambulatory Visit: Payer: BC Managed Care – PPO | Admitting: Cardiovascular Disease

## 2021-07-12 ENCOUNTER — Encounter: Payer: Self-pay | Admitting: Cardiovascular Disease

## 2021-07-12 VITALS — BP 110/82 | HR 61 | Ht 69.0 in | Wt 185.2 lb

## 2021-07-12 DIAGNOSIS — E7841 Elevated Lipoprotein(a): Secondary | ICD-10-CM | POA: Diagnosis not present

## 2021-07-12 DIAGNOSIS — Z8249 Family history of ischemic heart disease and other diseases of the circulatory system: Secondary | ICD-10-CM

## 2021-07-12 DIAGNOSIS — I10 Essential (primary) hypertension: Secondary | ICD-10-CM

## 2021-07-12 DIAGNOSIS — I440 Atrioventricular block, first degree: Secondary | ICD-10-CM

## 2021-07-12 DIAGNOSIS — E785 Hyperlipidemia, unspecified: Secondary | ICD-10-CM | POA: Diagnosis not present

## 2021-07-12 NOTE — Patient Instructions (Signed)
Medication Instructions:  Your physician recommends that you continue on your current medications as directed. Please refer to the Current Medication list given to you today.  *If you need a refill on your cardiac medications before your next appointment, please call your pharmacy*  Lab Work: Your physician recommends that you return for lab work prior to January 2024 follow up appointment:  CBC CMP Fasting Lipid Panel-DO NOT eat or drink past midnight. Okay to have water  TSH If you have labs (blood work) drawn today and your tests are completely normal, you will receive your results only by: MyChart Message (if you have MyChart) OR A paper copy in the mail If you have any lab test that is abnormal or we need to change your treatment, we will call you to review the results.  Testing/Procedures: NONE ordered at this time of appointment   Follow-Up: At [facility], you and your health needs are our priority.  As part of our continuing mission to provide you with exceptional heart care, we have created designated Provider Care Teams.  These Care Teams include your primary Cardiologist (physician) and Advanced Practice Providers (APPs -  Physician Assistants and Nurse Practitioners) who all work together to provide you with the care you need, when you need it.   Your next appointment:   7 month(s)  The format for your next appointment:   In Person  Provider:   Nicki Guadalajara, MD     Other Instructions   Important Information About Sugar

## 2021-07-12 NOTE — Progress Notes (Signed)
Cardiology Office Note    Date:  07/12/2021   ID:  [name], Mathews 01/31/1970, MRN [id number]  PCP:  Chesley Noon, MD  Cardiologist:  Shelva Majestic, MD   6 month F/U evaluaiton   History of Present Illness:  Alexander Booker is a 51 y.o. male who presents to the office today for a 6 month follow-up cardiology evaluation.  Alexander Booker has a very strong family history for premature coronary artery disease. His father had suffered initial heart attack at age 20 and underwent 2 CABG revascularization surgeries. Three of 4 of his father's siblings had significant heart problems with one uncle suffering a heart attack in his 79s and  one dying at age 55 with sudden death.   Alexander Booker has a history of hyperlipidemia. He has been demonstrated to have inferolateral T-wave inversion on his ECG.  In 2010, a cardiac CT was performed and revealed  a calcium score 58 with small nonocclusive plaque that was mixed and calcified in the proximal LAD. A nuclear perfusion study in 2012 showed normal perfusion. He has low HDL levels. In the past he was unable to tolerate Niaspan. Remotely had performed a [place] lab assessment.   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 This has reduced the amount of exercise that he had been able to do. When I had seen him last year he had lost almost 20 pounds from the year before. He has gained approximately half of that back. He denies chest pain PND orthopnea or shortness of breath.   He underwent an NMR LipoProfile: total cholesterol was 122, triglycerides 84, HDL 40 and LDL cholesterol was 65.  However, he had significant increased small LDL particles at 952 leading to a significant increase in LDL particle number at 1212.  He also was insulin resistant at 65 and had significant increased VLDL size, concordant with an atherogenic dyslipidemic pattern.  Once that lab was complete, I recommended further titration of his Crestor from  20 mg to 40 mg.  He has tolerated this well without myalgias.  I had not seen him in over 5 years but saw him in February 2020 when he reestablished care with me.  Over the previous 5 years he  continued to do fairly well.  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  Recently, an employee from work died suddenly at age 79 and another friend of his died with an MI at age 63.  He has noticed that in comparison to walking on flat surfaces when he tends to walk uphill or walk up steps he begins to notice exertional shortness of breath which he had not previously experienced.  With recent deaths of close acquaintances he presents now for follow-up cardiology route evaluation.  When I saw him on January 30, 2018  he had noticed a vague sensation in his chest and development of some mild exertional shortness of breath with significant activity.  I scheduled him to undergo an echo Doppler study which was done on January 25, 2018 and showed normal systolic and diastolic function.  He had normal valvular architecture.  I referred him for a coronary CTA which was done on March 07, 2018.  Coronary calcium score was 157 Agatston units placing him in the 93rd percentile for age and gender.  He was found to have mild nonobstructive disease  with mixed plaque in the proximal LAD felt to be mild and less than 50%.  There also was mild calcified plaque without significant stenosis in the mid RCA.  FFR analysis was not done.  When I saw him for follow-up evaluation of March 19, 2018 he still noted some  mild exertional dyspnea with significant exertion.  He denied any other chest discomfort or palpitations,PND or orthopnea.  He denied exertional dyspnea.  With his CTA result I started him on amlodipine 2.5 mg and suggested potential titration of 5 mg depending upon blood pressure.  He also suggested he change his Toprol and take  this in the morning with amlodipine in the evening.  To be more aggressive with lipid management I added Zetia to his statin therapy.  I saw him on June 29, 2019.  At that time he remained stable and denied any chest pain, PND orthopnea and was unaware any palpitations.     I last saw him on January 24, 2021.  Since his prior evaluation he admitted that he had only been minimally active was not as active as he had been in the past. He has a lingering right inguinal hernia and is tentatively scheduled to have surgery on March 14, 2021 by Dr. Armandina Gemma.  He denied any chest pain.  However because he has not been very active he admits to perhaps getting more winded if he overexerts himself.  He was unaware of associated palpitations.  He has continued to be on metoprolol succinate 50 mg daily and amlodipine 2.5 mg daily for blood pressure control.  He is on Zetia 10 mg and maximum dose rosuvastatin 40 mg for hyperlipidemia.  He had not had recent laboratory but LDL cholesterol in June 2021 was 56, total cholesterol 112, HDL cholesterol 37 and triglycerides 103.  Creatinine was 1.1.   He underwent follow-up laboratory on January 26, 2021 which showed total cholesterol 110, HDL 38, LDL 52, triglycerides 108.  LP(a) was elevated at 196.  Presently, he feels well and denies any chest pain or shortness of breath.  He continues to work as a Lobbyist.   He does play golf several days per week.  He denies myalgias.  He denies any palpitations.  He presents for reevaluation.   Past Medical History:  Diagnosis Date   Coronary artery disease 3/10   non obsructive Ca++ on CT of [place]   Dyslipidemia     No past surgical history on file.  Current Medications: Outpatient Medications Prior to Visit  Medication Sig Dispense Refill   amLODipine (NORVASC) 2.5 MG tablet TAKE 1 TABLET BY MOUTH EVERY DAY 90 tablet 3   aspirin 81 MG tablet Take 81 mg by mouth daily.     citalopram (CELEXA) 20 MG  tablet Take 20 mg by mouth daily.     escitalopram (LEXAPRO) 10 MG tablet Take 10 mg by mouth daily.     ezetimibe (ZETIA) 10 MG tablet TAKE 1 TABLET BY MOUTH EVERY DAY 90 tablet 0   fluticasone (FLONASE) 50 MCG/ACT nasal spray USE 1 SPRAY IN EACH NOSTRIL EVERY DAY AS NEEDED     metoprolol succinate (TOPROL-XL) 50 MG 24 hr tablet Take 1 tablet (50 mg total) by mouth daily. 30 tablet 11   rosuvastatin (CRESTOR) 40 MG tablet TAKE 1 TABLET BY MOUTH EVERY DAY 30 tablet 3   No facility-administered medications prior to visit.     Allergies:   Penicillins and Niacin and related  Social History   Socioeconomic History   Marital status: Married    Spouse name: Not on file   Number of children: Not on file   Years of education: Not on file   Highest education level: Not on file  Occupational History   Not on file  Tobacco Use   Smoking status: Never   Smokeless tobacco: Never  Substance and Sexual Activity   Alcohol use: Yes    Alcohol/week: 1.0 standard drink of alcohol    Types: 1 drink(s) per week   Drug use: No   Sexual activity: Not on file  Other Topics Concern   Not on file  Social History Narrative   Not on file   Social Determinants of Health   Financial Resource Strain: Not on file  Food Insecurity: Not on file  Transportation Needs: Not on file  Physical Activity: Not on file  Stress: Not on file  Social Connections: Not on file     Family History:  The patient's family history includes Coronary artery disease (age of onset: 57) in his father; Heart attack in his father.   ROS General: Negative; No fevers, chills, or night sweats;  HEENT: Negative; No changes in vision or hearing, sinus congestion, difficulty swallowing Pulmonary: Negative; No cough, wheezing, shortness of breath, hemoptysis Cardiovascular: Positive for exertional shortness of breath with uphill walking or climbing steps; GI: Negative; No nausea, vomiting, diarrhea, or abdominal pain GU:  Negative; No dysuria, hematuria, or difficulty voiding Musculoskeletal: Negative; no myalgias, joint pain, or weakness Hematologic/Oncology: Negative; no easy bruising, bleeding Endocrine: Negative; no heat/cold intolerance; no diabetes Neuro: Negative; no changes in balance, headaches Skin: Negative; No rashes or skin lesions Psychiatric: Negative; No behavioral problems, depression Sleep: Negative; No snoring, daytime sleepiness, hypersomnolence, bruxism, restless legs, hypnogognic hallucinations, no cataplexy Other comprehensive 14 point system review is negative.   PHYSICAL EXAM:   VS:  BP 110/82 (BP Location: Left Arm, Patient Position: Sitting, Cuff Size: Normal)   Pulse 61   Ht '5\' 9"'  (1.753 m)   Wt 185 lb 3.2 oz (84 kg)   SpO2 96%   BMI 27.35 kg/m    Repeat blood pressure by me was 110/70  Wt Readings from Last 3 Encounters:  07/12/21 185 lb 3.2 oz (84 kg)  01/24/21 186 lb 12.8 oz (84.7 kg)  06/29/19 185 lb 12.8 oz (84.3 kg)    General: Alert, oriented, no distress.  Skin: normal turgor, no rashes, warm and dry HEENT: Normocephalic, atraumatic. Pupils equal round and reactive to light; sclera anicteric; extraocular muscles intact; Fundi ** Nose without nasal septal hypertrophy Mouth/Parynx benign; Mallinpatti scale 2 Neck: No JVD, no carotid bruits; normal carotid upstroke Lungs: clear to ausculatation and percussion; no wheezing or rales Chest wall: without tenderness to palpitation Heart: PMI not displaced, RRR, s1 s2 normal, 1/6 systolic murmur, no diastolic murmur, no rubs, gallops, thrills, or heaves Abdomen: soft, nontender; no hepatosplenomehaly, BS+; abdominal aorta nontender and not dilated by palpation. Back: no CVA tenderness Pulses 2+ Musculoskeletal: full range of motion, normal strength, no joint deformities Extremities: no clubbing cyanosis or edema, Homan's sign negative  Neurologic: grossly nonfocal; Cranial nerves grossly wnl Psychologic: Normal mood  and affect    Studies/Labs Reviewed:   July 12, 2021 ECG (independently read by me): NSR at 61; 1st degree AV block; PR 212 msec  January 24, 2021 ECG (independently read by me):  Sinus bradycardia at 59; PR 202 msesc  June 29, 2019 ECG (independently read by  me): NSR at 68, nonspecific T changes inferiorly; no ectopy; normal intervals  March 19, 2018 ECG (independently read by me): Normal sinus rhythm at 65 bpm.  No ectopy.  Normal intervals.  January 30, 2018 ECG (independently read by me): Normal sinus rhythm at 69 bpm.  Nonspecific T wave abnormality inferiorly and in leads V4 through V6.  December 15, 2013 ECG (independently read by me): Normal sinus rhythm at 71 bpm.  Nonspecific T changes in lead III and aVF.  Recent Labs:    Latest Ref Rng & Units 01/26/2021    8:53 AM 07/10/2019    9:31 AM 02/03/2018    9:22 AM  BMP  Glucose 70 - 99 mg/dL 88  85  92   BUN 6 - 24 mg/dL '11  11  15   ' Creatinine 0.76 - 1.27 mg/dL 0.97  1.10  0.99   BUN/Creat Ratio 9 - '20 11  10  15   ' Sodium 134 - 144 mmol/L 142  141  142   Potassium 3.5 - 5.2 mmol/L 4.8  5.1  4.4   Chloride 96 - 106 mmol/L 105  106  107   CO2 20 - 29 mmol/L '25  24  22   ' Calcium 8.7 - 10.2 mg/dL 9.1  9.4  8.9         Latest Ref Rng & Units 01/26/2021    8:53 AM 07/10/2019    9:31 AM 02/03/2018    9:22 AM  Hepatic Function  Total Protein 6.0 - 8.5 g/dL 6.3  6.5  6.1   Albumin 3.8 - 4.9 g/dL 4.5  4.1  4.0   AST 0 - 40 IU/L 33  26  27   ALT 0 - 44 IU/L 34  27  23   Alk Phosphatase 44 - 121 IU/L 56  68  46   Total Bilirubin 0.0 - 1.2 mg/dL 1.0  0.9  0.7        Latest Ref Rng & Units 01/26/2021    8:53 AM 07/10/2019    9:31 AM 02/03/2018    9:22 AM  CBC  WBC 3.4 - 10.8 x10E3/uL 5.3  5.0  3.7   Hemoglobin 13.0 - 17.7 g/dL 15.2  14.8  13.9   Hematocrit 37.5 - 51.0 % 43.9  43.2  40.5   Platelets 150 - 450 x10E3/uL 235  212  203    Lab Results  Component Value Date   MCV 84 01/26/2021   MCV 84 07/10/2019   MCV 84  02/03/2018   Lab Results  Component Value Date   TSH 0.963 01/26/2021   Lab Results  Component Value Date   HGBA1C 5.7 (H) 02/18/2014     BNP No results found for: "BNP"  ProBNP No results found for: "PROBNP"   Lipid Panel     Component Value Date/Time   CHOL 110 01/26/2021 0853   CHOL 122 11/24/2012 0817   TRIG 108 01/26/2021 0853   TRIG 84 11/24/2012 0817   HDL 38 (L) 01/26/2021 0853   HDL 40 11/24/2012 0817   CHOLHDL 2.9 01/26/2021 0853   LDLCALC 52 01/26/2021 0853   LDLCALC 65 11/24/2012 0817     RADIOLOGY: No results found.   Additional studies/ records that were reviewed today include:   ------------------------------------------------------------------- 02/14/2018 ECHO Study Conclusions   - Left ventricle: Moderate basal septal hyprophy. The cavity size   was normal. Systolic function was normal. Wall motion was normal;   there were no regional wall  motion abnormalities. Left   ventricular diastolic function parameters were normal. - Atrial septum: No defect or patent foramen ovale was identified.  ASSESSMENT:    1. Hyperlipidemia with target LDL less than 70   2. Elevated Lp(a)   3. Family history of premature coronary artery disease   4. Primary hypertension   5. First degree AV block    PLAN:  Alexander Booker is a 51 year-old gentleman who has a strong family history for premature coronary artery disease with multiple family members having suffered initial heart attacks in the 44s.  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  I had increased his rosuvastatin to 40 mg and had not seen him for 5 years.  When I saw him in 2020 he had developed mild exertional  shortness of breath particularly with walking uphill or climbing steps.  He denied significant chest  tightness.  He has been on Toprol-XL 50 mg daily when I saw him in January 2020 his ECG revealed sinus rhythm at 69 with mild inferior and inferolateral T wave abnormalities.  An echo Doppler demonstrated normal systolic as well as diastolic function.  His most recent coronary CTA from 2020 showed a calcium score of 157. He was felt to have nonobstructive stenoses with narrowing less than 50% in the LAD and less in the mid RCA.  His blood pressure today is controlled on his regimen of Toprol-XL 50 mg daily and amlodipine 2.5 mg.  He continues to be on Zetia 10 mg with rosuvastatin 40 mg daily.  Most recent lipid studies from January 2023 were now excellent with LDL cholesterol 52 total cholesterol 110.  His LP(a) is significantly increased at 196 which is an independent risk factor for CAD with potential risk for vulnerable plaque.  For this reason I have recommended very aggressive lipid management with LDLs in the 50s or below if at all possible.  He is on a baby aspirin and should continue this.  He remains asymptomatic with reference to chest pain and denies any significant increased shortness of breath.  He will continue current therapy.  In January 2024 I will repeat a fasting c-Met, CBC, TSH and lipid studies and will see him in the office for follow-up evaluation.    Medication Adjustments/Labs and Tests Ordered: Current medicines are reviewed at length with the patient today.  Concerns regarding medicines are outlined above.  Medication changes, Labs and Tests ordered today are listed in the Patient Instructions below. Patient Instructions  Medication Instructions:  Your physician recommends that you continue on your current medications as directed. Please refer to the Current Medication list given to you today.  *If you need a refill on your cardiac medications before your next appointment, please call your pharmacy*  Lab Work: Your physician recommends that you return for lab work prior to  January 2024 follow up appointment:  CBC CMP Fasting Lipid Panel-DO NOT eat or drink past midnight. Okay to have water  TSH If you have labs (blood work) drawn today and your tests are completely normal, you will receive your results only by: [facility] (if you have MyChart) OR A paper copy in the mail If you have any lab test that is abnormal or we need to change your treatment, we will call you to review the results.  Testing/Procedures: NONE ordered at this  time of appointment   Follow-Up: At [facility], you and your health needs are our priority.  As part of our continuing mission to provide you with exceptional heart care, we have created designated Provider Care Teams.  These Care Teams include your primary Cardiologist (physician) and Advanced Practice Providers (APPs -  Physician Assistants and Nurse Practitioners) who all work together to provide you with the care you need, when you need it.   Your next appointment:   7 month(s)  The format for your next appointment:   In Person  Provider:   Shelva Majestic, MD     Other Instructions   Important Information About Sugar         Signed, Shelva Majestic, MD  07/12/2021 2:46 PM    [facility] [address] [place], [place], [place]  [address] Phone: [phone]

## 2021-08-07 ENCOUNTER — Other Ambulatory Visit: Payer: Self-pay | Admitting: Cardiovascular Disease

## 2021-09-02 ENCOUNTER — Other Ambulatory Visit: Payer: Self-pay | Admitting: Cardiovascular Disease

## 2021-10-21 ENCOUNTER — Other Ambulatory Visit: Payer: Self-pay | Admitting: Cardiovascular Disease

## 2022-01-25 LAB — CBC

## 2022-01-26 LAB — LIPID PANEL
Chol/HDL Ratio: 2.4 ratio (ref 0.0–5.0)
Cholesterol, Total: 118 mg/dL (ref 100–199)
HDL: 50 mg/dL (ref >39)
LDL Chol Calc (NIH): 54 mg/dL (ref 0–99)
Triglycerides: 65 mg/dL (ref 0–149)
VLDL Cholesterol Cal: 14 mg/dL (ref 5–40)

## 2022-01-26 LAB — COMPREHENSIVE METABOLIC PANEL
ALT: 28 IU/L (ref 0–44)
AST: 23 IU/L (ref 0–40)
Albumin/Globulin Ratio: 2.2 (ref 1.2–2.2)
Albumin: 4.7 g/dL (ref 3.8–4.9)
Alkaline Phosphatase: 60 IU/L (ref 44–121)
BUN/Creatinine Ratio: 18 (ref 9–20)
BUN: 18 mg/dL (ref 6–24)
Bilirubin Total: 1.6 mg/dL — ABNORMAL HIGH (ref 0.0–1.2)
CO2: 27 mmol/L (ref 20–29)
Calcium: 9.3 mg/dL (ref 8.7–10.2)
Chloride: 106 mmol/L (ref 96–106)
Creatinine, Ser: 0.98 mg/dL (ref 0.76–1.27)
Globulin, Total: 2.1 g/dL (ref 1.5–4.5)
Glucose: 86 mg/dL (ref 70–99)
Potassium: 4.9 mmol/L (ref 3.5–5.2)
Sodium: 145 mmol/L — ABNORMAL HIGH (ref 134–144)
Total Protein: 6.8 g/dL (ref 6.0–8.5)
eGFR: 92 mL/min/{1.73_m2} (ref >59)

## 2022-01-26 LAB — CBC
Hematocrit: 46.8 % (ref 37.5–51.0)
Hemoglobin: 15.7 g/dL (ref 13.0–17.7)
MCH: 28.9 pg (ref 26.6–33.0)
MCHC: 33.5 g/dL (ref 31.5–35.7)
MCV: 86 fL (ref 79–97)
Platelets: 194 10*3/uL (ref 150–450)
RBC: 5.43 x10E6/uL (ref 4.14–5.80)
RDW: 12.6 % (ref 11.6–15.4)
WBC: 4.5 10*3/uL (ref 3.4–10.8)

## 2022-01-26 LAB — TSH: TSH: 1.21 u[IU]/mL (ref 0.450–4.500)

## 2022-01-26 NOTE — Progress Notes (Addendum)
Cardiology Office Note    Date:  01/29/2022   ID:  Denney, Willy 17-Nov-1970, MRN [id number]  PCP:  Eartha Inch, MD  Cardiologist:  Nicki Guadalajara, MD   7 month F/U evaluaiton   History of Present Illness:  Alexander Booker is a 52 y.o. male who presents to the office today for a 6 month follow-up cardiology evaluation.  Alexander Booker has a very strong family history for premature coronary artery disease. His father had suffered initial heart attack at age 57 and underwent 2 CABG revascularization surgeries. Three of 4 of his father's siblings had significant heart problems with one uncle suffering a heart attack in his 52s and  one dying at age 24 with sudden death.   Alexander Booker has a history of hyperlipidemia. He has been demonstrated to have inferolateral T-wave inversion on his ECG.  In 2010, a cardiac CT was performed and revealed  a calcium score 58 with small nonocclusive plaque that was mixed and calcified in the proximal LAD. A nuclear perfusion study in 2012 showed normal perfusion. He has low HDL levels. In the past he was unable to tolerate Niaspan. Remotely had performed a [place] lab assessment.   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 This has reduced the amount of exercise that he had been able to do. When I had seen him last year he had lost almost 20 pounds from the year before. He has gained approximately half of that back. He denies chest pain PND orthopnea or shortness of breath.   He underwent an NMR LipoProfile: total cholesterol was 122, triglycerides 84, HDL 40 and LDL cholesterol was 65.  However, he had significant increased small LDL particles at 952 leading to a significant increase in LDL particle number at 1212.  He also was insulin resistant at 65 and had significant increased VLDL size, concordant with an atherogenic dyslipidemic pattern.  Once that lab was complete, I recommended further titration of his Crestor from  20 mg to 40 mg.  He has tolerated this well without myalgias.  I had not seen him in over 5 years but saw him in February 2020 when he reestablished care with me.  Over the previous 5 years he  continued to do fairly well.  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  Recently, an employee from work died suddenly at age 16 and another friend of his died with an MI at age 6.  He has noticed that in comparison to walking on flat surfaces when he tends to walk uphill or walk up steps he begins to notice exertional shortness of breath which he had not previously experienced.  With recent deaths of close acquaintances he presents now for follow-up cardiology route evaluation.  When I saw him on January 30, 2018  he had noticed a vague sensation in his chest and development of some mild exertional shortness of breath with significant activity.  I scheduled him to undergo an echo Doppler study which was done on January 25, 2018 and showed normal systolic and diastolic function.  He had normal valvular architecture.  I referred him for a coronary CTA which was done on March 07, 2018.  Coronary calcium score was 157 Agatston units placing him in the 93rd percentile for age and gender.  He was found to have mild nonobstructive disease  with mixed plaque in the proximal LAD felt to be mild and less than 50%.  There also was mild calcified plaque without significant stenosis in the mid RCA.  FFR analysis was not done.  When I saw him for follow-up evaluation of March 19, 2018 he still noted some  mild exertional dyspnea with significant exertion.  He denied any other chest discomfort or palpitations,PND or orthopnea.  He denied exertional dyspnea.  With his CTA result I started him on amlodipine 2.5 mg and suggested potential titration of 5 mg depending upon blood pressure.  He also suggested he change his Toprol and take  this in the morning with amlodipine in the evening.  To be more aggressive with lipid management I added Zetia to his statin therapy.  I saw him on June 29, 2019.  At that time he remained stable and denied any chest pain, PND orthopnea and was unaware any palpitations.     I last saw him on January 24, 2021.  Since his prior evaluation he admitted that he had only been minimally active was not as active as he had been in the past. He has a lingering right inguinal hernia and is tentatively scheduled to have surgery on March 14, 2021 by Dr. Darnell Booker.  He denied any chest pain.  However because he has not been very active he admits to perhaps getting more winded if he overexerts himself.  He was unaware of associated palpitations.  He has continued to be on metoprolol succinate 50 mg daily and amlodipine 2.5 mg daily for blood pressure control.  He is on Zetia 10 mg and maximum dose rosuvastatin 40 mg for hyperlipidemia.  He had not had recent laboratory but LDL cholesterol in June 2021 was 56, total cholesterol 112, HDL cholesterol 37 and triglycerides 103.  Creatinine was 1.1.   He underwent follow-up laboratory on January 26, 2021 which showed total cholesterol 110, HDL 38, LDL 52, triglycerides 108.  LP(a) was elevated at 196.  I last saw him on July 12, 2021.  He felt well and denied any chest pain or shortness of breath.  He was continuing to work as a Horticulturist, commercial.  He plays golf several days per week.  He denied any myalgias, palpitations, or change in exercise tolerance.    Since I last saw him, denies any chest pain or shortness of breath.  He admits that he has not been exercising.  He recently got a new dog and does take the dog on several walks per day.  He has continued to be on low-dose amlodipine 2.5 mg daily, metoprolol succinate 50 mg in addition to aspirin 81 mg  Zetia 10 mg and rosuvastatin 40 mg daily.  1 follow-up laboratory January 25, 2022 which shows total  cholesterol 118, significant improvement in HDL cholesterol up to 50, triglycerides down to 65, and LDL cholesterol is essentially unchanged at 54.  He presents for follow-up evaluation.  Past Medical History:  Diagnosis Date   Coronary artery disease 3/10   non obsructive Ca++ on CT of [place]   Dyslipidemia     No past surgical history on file.  Current Medications: Outpatient Medications Prior to Visit  Medication Sig Dispense Refill   amLODipine (NORVASC) 2.5 MG tablet TAKE 1 TABLET BY MOUTH EVERY DAY 90 tablet 3   aspirin 81 MG tablet Take 81 mg by mouth daily.     citalopram (CELEXA) 20 MG tablet Take 20 mg by mouth daily.  escitalopram (LEXAPRO) 10 MG tablet Take 10 mg by mouth daily.     ezetimibe (ZETIA) 10 MG tablet TAKE 1 TABLET BY MOUTH EVERY DAY 90 tablet 3   fluticasone (FLONASE) 50 MCG/ACT nasal spray USE 1 SPRAY IN EACH NOSTRIL EVERY DAY AS NEEDED     metoprolol succinate (TOPROL-XL) 50 MG 24 hr tablet Take 1 tablet (50 mg total) by mouth daily. 30 tablet 11   rosuvastatin (CRESTOR) 40 MG tablet TAKE 1 TABLET BY MOUTH EVERY DAY 90 tablet 3   No facility-administered medications prior to visit.     Allergies:   Penicillins and Niacin and related   Social History   Socioeconomic History   Marital status: Married    Spouse name: Not on file   Number of children: Not on file   Years of education: Not on file   Highest education Booker: Not on file  Occupational History   Not on file  Tobacco Use   Smoking status: Never   Smokeless tobacco: Never  Substance and Sexual Activity   Alcohol use: Yes    Alcohol/week: 1.0 standard drink of alcohol    Types: 1 drink(s) per week   Drug use: No   Sexual activity: Not on file  Other Topics Concern   Not on file  Social History Narrative   Not on file   Social Determinants of Health   Financial Resource Strain: Not on file  Food Insecurity: Not on file  Transportation Needs: Not on file  Physical Activity: Not  on file  Stress: Not on file  Social Connections: Not on file     Family History:  The patient's family history includes Coronary artery disease (age of onset: 32) in his father; Heart attack in his father.   ROS General: Negative; No fevers, chills, or night sweats;  HEENT: Negative; No changes in vision or hearing, sinus congestion, difficulty swallowing Pulmonary: Negative; No cough, wheezing, shortness of breath, hemoptysis Cardiovascular: Positive for exertional shortness of breath with uphill walking or climbing steps; GI: Negative; No nausea, vomiting, diarrhea, or abdominal pain GU: Negative; No dysuria, hematuria, or difficulty voiding Musculoskeletal: Negative; no myalgias, joint pain, or weakness Hematologic/Oncology: Negative; no easy bruising, bleeding Endocrine: Negative; no heat/cold intolerance; no diabetes Neuro: Negative; no changes in balance, headaches Skin: Negative; No rashes or skin lesions Psychiatric: Negative; No behavioral problems, depression Sleep: Negative; No snoring, daytime sleepiness, hypersomnolence, bruxism, restless legs, hypnogognic hallucinations, no cataplexy Other comprehensive 14 point system review is negative.   PHYSICAL EXAM:   VS:  BP 98/62 (BP Location: Left Arm, Patient Position: Sitting, Cuff Size: Normal)   Pulse 67   Ht 5\' 9"  (1.753 m)   Wt 174 lb 12.8 oz (79.3 kg)   SpO2 97%   BMI 25.81 kg/m    Repeat blood pressure by me was 115/72  Wt Readings from Last 3 Encounters:  01/29/22 174 lb 12.8 oz (79.3 kg)  07/12/21 185 lb 3.2 oz (84 kg)  01/24/21 186 lb 12.8 oz (84.7 kg)     BP 98/62 (BP Location: Left Arm, Patient Position: Sitting, Cuff Size: Normal)   Pulse 67   Ht 5\' 9"  (1.753 m)   Wt 174 lb 12.8 oz (79.3 kg)   SpO2 97%   BMI 25.81 kg/m  General: Alert, oriented, no distress.  Skin: normal turgor, no rashes, warm and dry; male pattern alopecia HEENT: Normocephalic, atraumatic. Pupils equal round and reactive to  light; sclera anicteric; extraocular muscles intact;  Nose  without nasal septal hypertrophy Mouth/Parynx benign; Mallinpatti scale 2 Neck: No JVD, no carotid bruits; normal carotid upstroke Lungs: clear to ausculatation and percussion; no wheezing or rales Chest wall: without tenderness to palpitation Heart: PMI not displaced, RRR, s1 s2 normal, 1/6 systolic murmur, no diastolic murmur, no rubs, gallops, thrills, or heaves Abdomen: soft, nontender; no hepatosplenomehaly, BS+; abdominal aorta nontender and not dilated by palpation. Back: no CVA tenderness Pulses 2+ Musculoskeletal: full range of motion, normal strength, no joint deformities Extremities: no clubbing cyanosis or edema, Homan's sign negative  Neurologic: grossly nonfocal; Cranial nerves grossly wnl Psychologic: Normal mood and affect     Studies/Labs Reviewed:   January 29, 2022 ECG (independently read by me): NSR at 67, nonspecific T wST wave abnormality, PR 180 msec  July 12, 2021 ECG (independently read by me): NSR at 61; 1st degree AV block; PR 212 msec  January 24, 2021 ECG (independently read by me):  Sinus bradycardia at 59; PR 202 msesc  June 29, 2019 ECG (independently read by me): NSR at 68, nonspecific T changes inferiorly; no ectopy; normal intervals  March 19, 2018 ECG (independently read by me): Normal sinus rhythm at 65 bpm.  No ectopy.  Normal intervals.  January 30, 2018 ECG (independently read by me): Normal sinus rhythm at 69 bpm.  Nonspecific T wave abnormality inferiorly and in leads V4 through V6.  December 15, 2013 ECG (independently read by me): Normal sinus rhythm at 71 bpm.  Nonspecific T changes in lead III and aVF.  Recent Labs:    Latest Ref Rng & Units 01/25/2022    8:28 AM 01/26/2021    8:53 AM 07/10/2019    9:31 AM  BMP  Glucose 70 - 99 mg/dL 86  88  85   BUN 6 - 24 mg/dL 18  11  11    Creatinine 0.76 - 1.27 mg/dL 9.56  2.13  0.86   BUN/Creat Ratio 9 - 20 18  11  10    Sodium 134 - 144  mmol/L 145  142  141   Potassium 3.5 - 5.2 mmol/L 4.9  4.8  5.1   Chloride 96 - 106 mmol/L 106  105  106   CO2 20 - 29 mmol/L 27  25  24    Calcium 8.7 - 10.2 mg/dL 9.3  9.1  9.4         Latest Ref Rng & Units 01/25/2022    8:28 AM 01/26/2021    8:53 AM 07/10/2019    9:31 AM  Hepatic Function  Total Protein 6.0 - 8.5 g/dL 6.8  6.3  6.5   Albumin 3.8 - 4.9 g/dL 4.7  4.5  4.1   AST 0 - 40 IU/L 23  33  26   ALT 0 - 44 IU/L 28  34  27   Alk Phosphatase 44 - 121 IU/L 60  56  68   Total Bilirubin 0.0 - 1.2 mg/dL 1.6  1.0  0.9        Latest Ref Rng & Units 01/25/2022    8:28 AM 01/26/2021    8:53 AM 07/10/2019    9:31 AM  CBC  WBC 3.4 - 10.8 x10E3/uL 4.5  5.3  5.0   Hemoglobin 13.0 - 17.7 g/dL 57.8  46.9  62.9   Hematocrit 37.5 - 51.0 % 46.8  43.9  43.2   Platelets 150 - 450 x10E3/uL 194  235  212    Lab Results  Component Value Date   MCV 86  01/25/2022   MCV 84 01/26/2021   MCV 84 07/10/2019   Lab Results  Component Value Date   TSH 1.210 01/25/2022   Lab Results  Component Value Date   HGBA1C 5.7 (H) 02/18/2014     BNP No results found for: "BNP"  ProBNP No results found for: "PROBNP"   Lipid Panel     Component Value Date/Time   CHOL 118 01/25/2022 0828   CHOL 122 11/24/2012 0817   TRIG 65 01/25/2022 0828   TRIG 84 11/24/2012 0817   HDL 50 01/25/2022 0828   HDL 40 11/24/2012 0817   CHOLHDL 2.4 01/25/2022 0828   LDLCALC 54 01/25/2022 0828   LDLCALC 65 11/24/2012 0817     RADIOLOGY: No results found.   Additional studies/ records that were reviewed today include:   ------------------------------------------------------------------- 02/14/2018 ECHO Study Conclusions   - Left ventricle: Moderate basal septal hyprophy. The cavity size   was normal. Systolic function was normal. Wall motion was normal;   there were no regional wall motion abnormalities. Left   ventricular diastolic function parameters were normal. - Atrial septum: No defect or patent  foramen ovale was identified.  ASSESSMENT:    1. Hyperlipidemia with target LDL less than 50   2. Coronary artery calcification   3. Primary hypertension   4. Elevated Lp(a)   5. Family history of premature coronary artery disease     PLAN:  Alexander Booker is a 52 year-old gentleman who has a strong family history for premature coronary artery disease with multiple family members having suffered initial heart attacks in the 20s.  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  I had increased his rosuvastatin to 40 mg and had not seen him for 5 years.  When I saw him in 2020 he had developed mild exertional  shortness of breath particularly with walking uphill or climbing steps.  He denied significant chest tightness.  He has been on Toprol-XL 50 mg daily when I saw him in January 2020 his ECG revealed sinus rhythm at 69 with mild inferior and inferolateral T wave abnormalities.  An echo Doppler demonstrated normal systolic as well as diastolic function.  His most recent coronary CTA from 2020 showed a calcium score of 157. He was felt to have nonobstructive stenoses with narrowing less than 50% in the LAD and less in the mid RCA.  He feels well.  He does not have any chest pain or shortness of breath.  Blood pressure is controlled and on repeat by me was 115/72 on his regimen of low-dose amlodipine 2.5 mg and Toprol-XL 50 mg daily.  Continue to be on Zetia 10 mg with rosuvastatin 40 mg and LP(a) was found to be significantly increased at 196.  I have set a goal of LDL less than 50.  I reviewed his most recent lipid panel which does show significant improvement in his HDL and triglycerides with total cholesterol now at 118, HDL 50, triglycerides 65, and LDL essentially is unchanged at 54.  Total bilirubin was minimally  increased at 1.2 but transaminases were normal with AST of 23 and ALT of 628.  He is unaware of any history of Gilbert's syndrome.  I discussed the importance of exercise at least 5 days/week if at all possible.  Remains asymptomatic without chest pain or shortness of breath.  He  continues to be on aspirin 81 mg.  He sees Dr. Cyndia Bent as needed for primary care.  In 6 months I have recommended repeat comprehensive metabolic panel and lipid studies and will see him in follow-up thereafter.   Medication Adjustments/Labs and Tests Ordered: Current medicines are reviewed at length with the patient today.  Concerns regarding medicines are outlined above.  Medication changes, Labs and Tests ordered today are listed in the Patient Instructions below. Patient Instructions  Medication Instructions:  No changes *If you need a refill on your cardiac medications before your next appointment, please call your pharmacy*   Lab Work: CMET and Lipid panel- fasting in 6mos If you have labs (blood work) drawn today and your tests are completely normal, you will receive your results only by: MyChart Message (if you have MyChart) OR A paper copy in the mail If you have any lab test that is abnormal or we need to change your treatment, we will call you to review the results.   Testing/Procedures: None   Follow-Up: At [facility], you and your health needs are our priority.  As part of our continuing mission to provide you with exceptional heart care, we have created designated Provider Care Teams.  These Care Teams include your primary Cardiologist (physician) and Advanced Practice Providers (APPs -  Physician Assistants and Nurse Practitioners) who all work together to provide you with the care you need, when you need it.  We recommend signing up for the patient portal called "MyChart".  Sign up information is provided on this After Visit Summary.  MyChart is used to connect with patients for Virtual  Visits (Telemedicine).  Patients are able to view lab/test results, encounter notes, upcoming appointments, etc.  Non-urgent messages can be sent to your provider as well.   To learn more about what you can do with MyChart, go to [email or web address].    Your next appointment:   6 month(s)  The format for your next appointment:   In Person  Provider:   Nicki Guadalajara, MD     Other Instructions None  Important Information About Sugar         Signed, Nicki Guadalajara, MD  01/29/2022 5:50 PM    [facility] [address], [address], [place], [place]  [address] Phone: [phone]

## 2022-01-29 ENCOUNTER — Encounter: Payer: Self-pay | Admitting: Cardiovascular Disease

## 2022-01-29 ENCOUNTER — Ambulatory Visit: Payer: BC Managed Care – PPO | Attending: Cardiovascular Disease | Admitting: Cardiovascular Disease

## 2022-01-29 VITALS — BP 98/62 | HR 67 | Ht 69.0 in | Wt 174.8 lb

## 2022-01-29 DIAGNOSIS — I10 Essential (primary) hypertension: Secondary | ICD-10-CM

## 2022-01-29 DIAGNOSIS — I2584 Coronary atherosclerosis due to calcified coronary lesion: Secondary | ICD-10-CM | POA: Diagnosis not present

## 2022-01-29 DIAGNOSIS — Z8249 Family history of ischemic heart disease and other diseases of the circulatory system: Secondary | ICD-10-CM

## 2022-01-29 DIAGNOSIS — I2510 Atherosclerotic heart disease of native coronary artery without angina pectoris: Secondary | ICD-10-CM

## 2022-01-29 DIAGNOSIS — E7841 Elevated Lipoprotein(a): Secondary | ICD-10-CM | POA: Diagnosis not present

## 2022-01-29 DIAGNOSIS — E785 Hyperlipidemia, unspecified: Secondary | ICD-10-CM

## 2022-01-29 NOTE — Patient Instructions (Signed)
Medication Instructions:  No changes *If you need a refill on your cardiac medications before your next appointment, please call your pharmacy*   Lab Work: CMET and Lipid panel- fasting in 88mos If you have labs (blood work) drawn today and your tests are completely normal, you will receive your results only by: [place] (if you have MyChart) OR A paper copy in the mail If you have any lab test that is abnormal or we need to change your treatment, we will call you to review the results.   Testing/Procedures: None   Follow-Up: At [facility], you and your health needs are our priority.  As part of our continuing mission to provide you with exceptional heart care, we have created designated Provider Care Teams.  These Care Teams include your primary Cardiologist (physician) and Advanced Practice Providers (APPs -  Physician Assistants and Nurse Practitioners) who all work together to provide you with the care you need, when you need it.  We recommend signing up for the patient portal called "MyChart".  Sign up information is provided on this After Visit Summary.  MyChart is used to connect with patients for Virtual Visits (Telemedicine).  Patients are able to view lab/test results, encounter notes, upcoming appointments, etc.  Non-urgent messages can be sent to your provider as well.   To learn more about what you can do with MyChart, go to [email or web address].    Your next appointment:   6 month(s)  The format for your next appointment:   In Person  Provider:   Shelva Majestic, MD     Other Instructions None  Important Information About Sugar

## 2022-09-01 ENCOUNTER — Other Ambulatory Visit: Payer: Self-pay | Admitting: Cardiovascular Disease

## 2022-09-25 ENCOUNTER — Other Ambulatory Visit: Payer: Self-pay | Admitting: Cardiovascular Disease

## 2022-10-10 ENCOUNTER — Telehealth: Payer: Self-pay

## 2022-10-10 NOTE — Telephone Encounter (Signed)
Patient walk in today asking for refill of Lexapro.  This is not prescribed by this office.  Called and LM to patient to call PCP.

## 2022-10-11 LAB — COMPREHENSIVE METABOLIC PANEL
ALT: 35 IU/L (ref 0–44)
AST: 34 IU/L (ref 0–40)
Albumin: 4.6 g/dL (ref 3.8–4.9)
Alkaline Phosphatase: 56 IU/L (ref 44–121)
BUN/Creatinine Ratio: 13 (ref 9–20)
BUN: 13 mg/dL (ref 6–24)
Bilirubin Total: 1.3 mg/dL — ABNORMAL HIGH (ref 0.0–1.2)
CO2: 25 mmol/L (ref 20–29)
Calcium: 9.4 mg/dL (ref 8.7–10.2)
Chloride: 103 mmol/L (ref 96–106)
Creatinine, Ser: 1.03 mg/dL (ref 0.76–1.27)
Globulin, Total: 1.8 g/dL (ref 1.5–4.5)
Glucose: 87 mg/dL (ref 70–99)
Potassium: 4.7 mmol/L (ref 3.5–5.2)
Sodium: 141 mmol/L (ref 134–144)
Total Protein: 6.4 g/dL (ref 6.0–8.5)
eGFR: 86 mL/min/{1.73_m2} (ref >59)

## 2022-10-11 LAB — LIPID PANEL
Chol/HDL Ratio: 2.5 ratio (ref 0.0–5.0)
Cholesterol, Total: 103 mg/dL (ref 100–199)
HDL: 41 mg/dL (ref >39)
LDL Chol Calc (NIH): 46 mg/dL (ref 0–99)
Triglycerides: 79 mg/dL (ref 0–149)
VLDL Cholesterol Cal: 16 mg/dL (ref 5–40)

## 2022-10-22 ENCOUNTER — Encounter: Payer: Self-pay | Admitting: Cardiovascular Disease

## 2022-10-22 ENCOUNTER — Ambulatory Visit: Payer: BC Managed Care – PPO | Attending: Cardiovascular Disease | Admitting: Cardiovascular Disease

## 2022-10-22 VITALS — BP 124/82 | HR 63 | Ht 69.0 in | Wt 177.4 lb

## 2022-10-22 DIAGNOSIS — I2510 Atherosclerotic heart disease of native coronary artery without angina pectoris: Secondary | ICD-10-CM

## 2022-10-22 DIAGNOSIS — E7841 Elevated Lipoprotein(a): Secondary | ICD-10-CM

## 2022-10-22 DIAGNOSIS — E785 Hyperlipidemia, unspecified: Secondary | ICD-10-CM

## 2022-10-22 DIAGNOSIS — I10 Essential (primary) hypertension: Secondary | ICD-10-CM

## 2022-10-22 DIAGNOSIS — I2584 Coronary atherosclerosis due to calcified coronary lesion: Secondary | ICD-10-CM

## 2022-10-22 DIAGNOSIS — Z8249 Family history of ischemic heart disease and other diseases of the circulatory system: Secondary | ICD-10-CM

## 2022-10-22 NOTE — Patient Instructions (Signed)
Medication Instructions:  No changes *If you need a refill on your cardiac medications before your next appointment, please call your pharmacy*   Lab Work: none If you have labs (blood work) drawn today and your tests are completely normal, you will receive your results only by: MyChart Message (if you have MyChart) OR A paper copy in the mail If you have any lab test that is abnormal or we need to change your treatment, we will call you to review the results.   Testing/Procedures: none   Follow-Up: At [facility], you and your health needs are our priority.  As part of our continuing mission to provide you with exceptional heart care, we have created designated Provider Care Teams.  These Care Teams include your primary Cardiologist (physician) and Advanced Practice Providers (APPs -  Physician Assistants and Nurse Practitioners) who all work together to provide you with the care you need, when you need it.  We recommend signing up for the patient portal called "MyChart".  Sign up information is provided on this After Visit Summary.  MyChart is used to connect with patients for Virtual Visits (Telemedicine).  Patients are able to view lab/test results, encounter notes, upcoming appointments, etc.  Non-urgent messages can be sent to your provider as well.   To learn more about what you can do with MyChart, go to [email or web address].    Your next appointment:   6 month(s)  Provider:   Nicki Guadalajara, MD

## 2022-10-22 NOTE — Progress Notes (Unsigned)
Cardiology Office Note    Date:  10/22/2022   ID:  Alexander, Booker February 23, 1970, MRN [id number]  PCP:  Alexander Inch, MD  Cardiologist:  Alexander Guadalajara, MD   9 month F/U evaluaiton   History of Present Illness:  Alexander Booker is a 52 y.o. male who presents to the office today for a 9 month follow-up cardiology evaluation.  Alexander Booker has a very strong family history for premature coronary artery disease. His father had suffered initial heart attack at age 56 and underwent 2 CABG revascularization surgeries. Three of 4 of his father's siblings had significant heart problems with one uncle suffering a heart attack in his 28s and  one dying at age 66 with sudden death.   Alexander Booker has a history of hyperlipidemia. Alexander Booker has been demonstrated to have inferolateral T-wave inversion on his ECG.  In 2010, a cardiac CT was performed and revealed  a calcium score 58 with small nonocclusive plaque that was mixed and calcified in the proximal LAD. A nuclear perfusion study in 2012 showed normal perfusion. Alexander Booker has low HDL levels. In the past Alexander Booker was unable to tolerate Niaspan. Remotely had performed a [place] lab assessment.   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 This has reduced the amount of exercise that Alexander Booker had been able to do. When I had seen him last year Alexander Booker had lost almost 20 pounds from the year before. Alexander Booker has gained approximately half of that back. Alexander Booker denies chest pain PND orthopnea or shortness of breath.   Alexander Booker underwent an NMR LipoProfile: total cholesterol was 122, triglycerides 84, HDL 40 and LDL cholesterol was 65.  However, Alexander Booker had significant increased small LDL particles at 952 leading to a significant increase in LDL particle number at 1212.  Alexander Booker also was insulin resistant at 65 and had significant increased VLDL size, concordant with an atherogenic dyslipidemic pattern.  Once that lab was complete, I recommended further titration of his Crestor from  20 mg to 40 mg.  Alexander Booker has tolerated this well without myalgias.  I had not seen him in over 5 years but saw him in February 2020 when Alexander Booker reestablished care with me.  Over the previous 5 years Alexander Booker  continued to do fairly well.  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  Recently, an employee from work died suddenly at age 54 and another friend of his died with an MI at age 27.  Alexander Booker has noticed that in comparison to walking on flat surfaces when Alexander Booker tends to walk uphill or walk up steps Alexander Booker begins to notice exertional shortness of breath which Alexander Booker had not previously experienced.  With recent deaths of close acquaintances Alexander Booker presents now for follow-up cardiology route evaluation.  When I saw him on January 30, 2018  Alexander Booker had noticed a vague sensation in his chest and development of some mild exertional shortness of breath with significant activity.  I scheduled him to undergo an echo Doppler study which was done on January 25, 2018 and showed normal systolic and diastolic function.  Alexander Booker had normal valvular architecture.  I referred him for a coronary CTA which was done on March 07, 2018.  Coronary calcium score was 157 Agatston units placing him in the 93rd percentile for age and gender.  Alexander Booker was found to have mild nonobstructive disease  with mixed plaque in the proximal LAD felt to be mild and less than 50%.  There also was mild calcified plaque without significant stenosis in the mid RCA.  FFR analysis was not done.  When I saw him for follow-up evaluation of March 19, 2018 Alexander Booker still noted some  mild exertional dyspnea with significant exertion.  Alexander Booker denied any other chest discomfort or palpitations,PND or orthopnea.  Alexander Booker denied exertional dyspnea.  With his CTA result I started him on amlodipine 2.5 mg and suggested potential titration of 5 mg depending upon blood pressure.  Alexander Booker also suggested Alexander Booker change his Toprol and take  this in the morning with amlodipine in the evening.  To be more aggressive with lipid management I added Zetia to his statin therapy.  I saw him on June 29, 2019.  At that time Alexander Booker remained stable and denied any chest pain, PND orthopnea and was unaware any palpitations.     I saw him on January 24, 2021.  Since his prior evaluation Alexander Booker admitted that Alexander Booker had only been minimally active was not as active as Alexander Booker had been in the past. Alexander Booker has a lingering right inguinal hernia and is tentatively scheduled to have surgery on March 14, 2021 by Dr. Darnell Booker.  Alexander Booker denied any chest pain.  However because Alexander Booker has not been very active Alexander Booker admits to perhaps getting more winded if Alexander Booker overexerts himself.  Alexander Booker was unaware of associated palpitations.  Alexander Booker has continued to be on metoprolol succinate 50 mg daily and amlodipine 2.5 mg daily for blood pressure control.  Alexander Booker is on Zetia 10 mg and maximum dose rosuvastatin 40 mg for hyperlipidemia.  Alexander Booker had not had recent laboratory but LDL cholesterol in June 2021 was 56, total cholesterol 112, HDL cholesterol 37 and triglycerides 103.  Creatinine was 1.1.   Alexander Booker underwent follow-up laboratory on January 26, 2021 which showed total cholesterol 110, HDL 38, LDL 52, triglycerides 108.  LP(a) was elevated at 196.  I saw him on July 12, 2021.  Alexander Booker felt well and denied any chest pain or shortness of breath.  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  Alexander Booker plays golf several days per week.  Alexander Booker denied any myalgias, palpitations, or change in exercise tolerance.    I last saw him on January 8, 2-024.  Hedenied any chest pain or shortness of breath.  Alexander Booker admits that Alexander Booker has not been exercising.  Alexander Booker recently got a new dog and does take the dog on several walks per day.  Alexander Booker has continued to be on low-dose amlodipine 2.5 mg daily, metoprolol succinate 50 mg in addition to aspirin 81 mg  Zetia 10 mg and rosuvastatin 40 mg daily.  1 follow-up laboratory January 25, 2022 which shows total  cholesterol 118, significant improvement in HDL cholesterol up to 50, triglycerides down to 65, and LDL cholesterol is essentially unchanged at 54.  Alexander Booker presents for follow-up evaluation.  Past Medical History:  Diagnosis Date   Coronary artery disease 3/10   non obsructive Ca++ on CT of [place]   Dyslipidemia     No past surgical history on file.  Current Medications: Outpatient Medications Prior to Visit  Medication Sig Dispense Refill   amLODipine (NORVASC) 2.5 MG tablet TAKE 1 TABLET BY MOUTH EVERY DAY 90 tablet 3   aspirin 81 MG tablet Take 81 mg by mouth daily.     citalopram (CELEXA) 20 MG tablet Take 20 mg by mouth  daily.     escitalopram (LEXAPRO) 10 MG tablet Take 10 mg by mouth daily.     ezetimibe (ZETIA) 10 MG tablet TAKE 1 TABLET BY MOUTH EVERY DAY 90 tablet 0   metoprolol succinate (TOPROL-XL) 50 MG 24 hr tablet Take 1 tablet (50 mg total) by mouth daily. 30 tablet 11   rosuvastatin (CRESTOR) 40 MG tablet TAKE 1 TABLET BY MOUTH EVERY DAY 90 tablet 3   fluticasone (FLONASE) 50 MCG/ACT nasal spray USE 1 SPRAY IN EACH NOSTRIL EVERY DAY AS NEEDED (Patient not taking: Reported on 10/22/2022)     No facility-administered medications prior to visit.     Allergies:   Penicillins and Niacin and related   Social History   Socioeconomic History   Marital status: Married    Spouse name: Not on file   Number of children: Not on file   Years of education: Not on file   Highest education Booker: Not on file  Occupational History   Not on file  Tobacco Use   Smoking status: Never   Smokeless tobacco: Never  Substance and Sexual Activity   Alcohol use: Yes    Alcohol/week: 1.0 standard drink of alcohol    Types: 1 drink(s) per week   Drug use: No   Sexual activity: Not on file  Other Topics Concern   Not on file  Social History Narrative   Not on file   Social Determinants of Health   Financial Resource Strain: Low Risk  (06/21/2022)   Received [facility]    Overall Financial Resource Strain (CARDIA)    Difficulty of Paying Living Expenses: Not hard at all  Food Insecurity: No Food Insecurity (06/21/2022)   Received from [facility]   Hunger Vital Sign    Worried About Running Out of Food in the Last Year: Never true    Ran Out of Food in the Last Year: Never true  Transportation Needs: No Transportation Needs (06/21/2022)   Received from [facility]    Lack of Transportation (Medical): No    Lack of Transportation (Non-Medical): No  Physical Activity: Not on file  Stress: Not on file  Social Connections: Unknown (06/04/2021)   Received from [facility]   Social Network    Social Network: Not on file     Family History:  The patient's family history includes Coronary artery disease (age of onset: 69) in his father; Heart attack in his father.   ROS General: Negative; No fevers, chills, or night sweats;  HEENT: Negative; No changes in vision or hearing, sinus congestion, difficulty swallowing Pulmonary: Negative; No cough, wheezing, shortness of breath, hemoptysis Cardiovascular: Positive for exertional shortness of breath with uphill walking or climbing steps; GI: Negative; No nausea, vomiting, diarrhea, or abdominal pain GU: Negative; No dysuria, hematuria, or difficulty voiding Musculoskeletal: Negative; no myalgias, joint pain, or weakness Hematologic/Oncology: Negative; no easy bruising, bleeding Endocrine: Negative; no heat/cold intolerance; no diabetes Neuro: Negative; no changes in balance, headaches Skin: Negative; No rashes or skin lesions Psychiatric: Negative; No behavioral problems, depression Sleep: Negative; No snoring, daytime sleepiness, hypersomnolence, bruxism, restless legs, hypnogognic hallucinations, no cataplexy Other comprehensive 14 point system review is negative.   PHYSICAL EXAM:   VS:  BP 124/82   Pulse 63   Ht 5\' 9"  (1.753 m)   Wt 177 lb 6.4 oz (80.5 kg)    SpO2 98%   BMI 26.20 kg/m    Repeat blood pressure by  me was 115/72  Wt Readings from Last 3 Encounters:  10/22/22 177 lb 6.4 oz (80.5 kg)  01/29/22 174 lb 12.8 oz (79.3 kg)  07/12/21 185 lb 3.2 oz (84 kg)      Physical Exam BP 124/82   Pulse 63   Ht 5\' 9"  (1.753 m)   Wt 177 lb 6.4 oz (80.5 kg)   SpO2 98%   BMI 26.20 kg/m  General: Alert, oriented, no distress.  Skin: normal turgor, no rashes, warm and dry HEENT: Normocephalic, atraumatic. Pupils equal round and reactive to light; sclera anicteric; extraocular muscles intact; Fundi ** Nose without nasal septal hypertrophy Mouth/Parynx benign; Mallinpatti scale Neck: No JVD, no carotid bruits; normal carotid upstroke Lungs: clear to ausculatation and percussion; no wheezing or rales Chest wall: without tenderness to palpitation Heart: PMI not displaced, RRR, s1 s2 normal, 1/6 systolic murmur, no diastolic murmur, no rubs, gallops, thrills, or heaves Abdomen: soft, nontender; no hepatosplenomehaly, BS+; abdominal aorta nontender and not dilated by palpation. Back: no CVA tenderness Pulses 2+ Musculoskeletal: full range of motion, normal strength, no joint deformities Extremities: no clubbing cyanosis or edema, Homan's sign negative  Neurologic: grossly nonfocal; Cranial nerves grossly wnl Psychologic: Normal mood and affect     BP 124/82   Pulse 63   Ht 5\' 9"  (1.753 m)   Wt 177 lb 6.4 oz (80.5 kg)   SpO2 98%   BMI 26.20 kg/m  General: Alert, oriented, no distress.  Skin: normal turgor, no rashes, warm and dry; male pattern alopecia HEENT: Normocephalic, atraumatic. Pupils equal round and reactive to light; sclera anicteric; extraocular muscles intact;  Nose without nasal septal hypertrophy Mouth/Parynx benign; Mallinpatti scale 2 Neck: No JVD, no carotid bruits; normal carotid upstroke Lungs: clear to ausculatation and percussion; no wheezing or rales Chest wall: without tenderness to palpitation Heart: PMI not  displaced, RRR, s1 s2 normal, 1/6 systolic murmur, no diastolic murmur, no rubs, gallops, thrills, or heaves Abdomen: soft, nontender; no hepatosplenomehaly, BS+; abdominal aorta nontender and not dilated by palpation. Back: no CVA tenderness Pulses 2+ Musculoskeletal: full range of motion, normal strength, no joint deformities Extremities: no clubbing cyanosis or edema, Homan's sign negative  Neurologic: grossly nonfocal; Cranial nerves grossly wnl Psychologic: Normal mood and affect     Studies/Labs Reviewed:    EKG Interpretation Date/Time:  Monday October 22 2022 13:29:52 EDT Ventricular Rate:  63 PR Interval:  200 QRS Duration:  92 QT Interval:  402 QTC Calculation: 411 R Axis:   56  Text Interpretation: Normal sinus rhythm Nonspecific ST changes Normal ECG When compared with ECG of 06-Apr-2008 05:09, T wave inversion less evident in Inferior leads Confirmed by Alexander Booker ([id number]) on 10/22/2022 1:45:00 PM    January 29, 2022 ECG (independently read by me): NSR at 67, nonspecific ST wave abnormality, PR 180 msec  July 12, 2021 ECG (independently read by me): NSR at 61; 1st degree AV block; PR 212 msec  January 24, 2021 ECG (independently read by me):  Sinus bradycardia at 59; PR 202 msesc  June 29, 2019 ECG (independently read by me): NSR at 68, nonspecific T changes inferiorly; no ectopy; normal intervals  March 19, 2018 ECG (independently read by me): Normal sinus rhythm at 65 bpm.  No ectopy.  Normal intervals.  January 30, 2018 ECG (independently read by me): Normal sinus rhythm at 69 bpm.  Nonspecific T wave abnormality inferiorly and in leads V4 through V6.  December 15, 2013 ECG (independently read by me): Normal  sinus rhythm at 71 bpm.  Nonspecific T changes in lead III and aVF.  Recent Labs:    Latest Ref Rng & Units 10/10/2022   12:00 AM 01/25/2022    8:28 AM 01/26/2021    8:53 AM  BMP  Glucose 70 - 99 mg/dL 87  86  88   BUN 6 - 24 mg/dL 13  18  11     Creatinine 0.76 - 1.27 mg/dL 1.61  0.96  0.45   BUN/Creat Ratio 9 - 20 13  18  11    Sodium 134 - 144 mmol/L 141  145  142   Potassium 3.5 - 5.2 mmol/L 4.7  4.9  4.8   Chloride 96 - 106 mmol/L 103  106  105   CO2 20 - 29 mmol/L 25  27  25    Calcium 8.7 - 10.2 mg/dL 9.4  9.3  9.1         Latest Ref Rng & Units 10/10/2022   12:00 AM 01/25/2022    8:28 AM 01/26/2021    8:53 AM  Hepatic Function  Total Protein 6.0 - 8.5 g/dL 6.4  6.8  6.3   Albumin 3.8 - 4.9 g/dL 4.6  4.7  4.5   AST 0 - 40 IU/L 34  23  33   ALT 0 - 44 IU/L 35  28  34   Alk Phosphatase 44 - 121 IU/L 56  60  56   Total Bilirubin 0.0 - 1.2 mg/dL 1.3  1.6  1.0        Latest Ref Rng & Units 01/25/2022    8:28 AM 01/26/2021    8:53 AM 07/10/2019    9:31 AM  CBC  WBC 3.4 - 10.8 x10E3/uL 4.5  5.3  5.0   Hemoglobin 13.0 - 17.7 g/dL 40.9  81.1  91.4   Hematocrit 37.5 - 51.0 % 46.8  43.9  43.2   Platelets 150 - 450 x10E3/uL 194  235  212    Lab Results  Component Value Date   MCV 86 01/25/2022   MCV 84 01/26/2021   MCV 84 07/10/2019   Lab Results  Component Value Date   TSH 1.210 01/25/2022   Lab Results  Component Value Date   HGBA1C 5.7 (H) 02/18/2014     BNP No results found for: "BNP"  ProBNP No results found for: "PROBNP"   Lipid Panel     Component Value Date/Time   CHOL 103 10/10/2022 0000   CHOL 122 11/24/2012 0817   TRIG 79 10/10/2022 0000   TRIG 84 11/24/2012 0817   HDL 41 10/10/2022 0000   HDL 40 11/24/2012 0817   CHOLHDL 2.5 10/10/2022 0000   LDLCALC 46 10/10/2022 0000   LDLCALC 65 11/24/2012 0817     RADIOLOGY: No results found.   Additional studies/ records that were reviewed today include:   ------------------------------------------------------------------- 02/14/2018 ECHO Study Conclusions   - Left ventricle: Moderate basal septal hyprophy. The cavity size   was normal. Systolic function was normal. Wall motion was normal;   there were no regional wall motion abnormalities.  Left   ventricular diastolic function parameters were normal. - Atrial septum: No defect or patent foramen ovale was identified.  ASSESSMENT:    1. Elevated Lp(a)   2. Primary hypertension     PLAN:  Alexander Booker is a 52 year-old gentleman who has a strong family history for premature coronary artery disease with multiple family members having suffered initial heart attacks in the 48s.  Alexander Booker has a history of hyperlipidemia and remote advanced lipid testing had shown a significant increase in LDL small particless well as insulin resistance and increasedVLDL concordant with an atherogenic dyslipidemic lipid profile.  I had increased his rosuvastatin to 40 mg and had not seen him for 5 years.  When I saw him in 2020 Alexander Booker had developed mild exertional  shortness of breath particularly with walking uphill or climbing steps.  Alexander Booker denied significant chest tightness.  Alexander Booker has been on Toprol-XL 50 mg daily when I saw him in January 2020 his ECG revealed sinus rhythm at 69 with mild inferior and inferolateral T wave abnormalities.  An echo Doppler demonstrated normal systolic as well as diastolic function.  His most recent coronary CTA from 2020 showed a calcium score of 157. Alexander Booker was felt to have nonobstructive stenoses with narrowing less than 50% in the LAD and less in the mid RCA.  Alexander Booker feels well.  Alexander Booker does not have any chest pain or shortness of breath.  Blood pressure is controlled and on repeat by me was 115/72 on his regimen of low-dose amlodipine 2.5 mg and Toprol-XL 50 mg daily.  Continue to be on Zetia 10 mg with rosuvastatin 40 mg and LP(a) was found to be significantly increased at 196.  I have set a goal of LDL less than 50.  I reviewed his most recent lipid panel which does show significant improvement in his HDL and triglycerides with total cholesterol now at 118, HDL 50, triglycerides 65, and LDL essentially is unchanged at 54.  Total bilirubin was minimally increased at 1.2 but transaminases were normal  with AST of 23 and ALT of 628.  Alexander Booker is unaware of any history of Gilbert's syndrome.  I discussed the importance of exercise at least 5 days/week if at all possible.  Remains asymptomatic without chest pain or shortness of breath.  Alexander Booker continues to be on aspirin 81 mg.  Alexander Booker sees Dr. Cyndia Bent as needed for primary care.  In 6 months I have recommended repeat comprehensive metabolic panel and lipid studies and will see him in follow-up thereafter.   Medication Adjustments/Labs and Tests Ordered: Current medicines are reviewed at length with the patient today.  Concerns regarding medicines are outlined above.  Medication changes, Labs and Tests ordered today are listed in the Patient Instructions below. There are no Patient Instructions on file for this visit.   Signed, Alexander Guadalajara, MD  10/22/2022 1:42 PM    [facility] [address], [address], [place], [place]  [address] Phone: [phone]

## 2022-10-23 ENCOUNTER — Encounter: Payer: Self-pay | Admitting: Cardiovascular Disease

## 2022-10-27 ENCOUNTER — Other Ambulatory Visit: Payer: Self-pay | Admitting: Cardiovascular Disease

## 2023-04-08 ENCOUNTER — Other Ambulatory Visit: Payer: Self-pay | Admitting: Cardiovascular Disease

## 2023-06-28 ENCOUNTER — Other Ambulatory Visit: Payer: Self-pay | Admitting: Cardiovascular Disease

## 2023-08-01 ENCOUNTER — Other Ambulatory Visit: Payer: Self-pay | Admitting: Cardiovascular Disease

## 2023-12-16 ENCOUNTER — Other Ambulatory Visit: Payer: Self-pay

## 2023-12-18 MED ORDER — AMLODIPINE BESYLATE 2.5 MG PO TABS: 2.5000 mg | ORAL_TABLET | Freq: Every day | ORAL | 0 refills | Status: AC

## 2023-12-30 ENCOUNTER — Other Ambulatory Visit: Payer: Self-pay

## 2024-01-01 ENCOUNTER — Other Ambulatory Visit: Payer: Self-pay | Admitting: General Practice

## 2024-01-02 MED ORDER — ROSUVASTATIN CALCIUM 40 MG PO TABS: 40.0000 mg | ORAL_TABLET | Freq: Every day | ORAL | 0 refills | Status: AC
